# Patient Record
Sex: Male | Born: 1945 | Race: White | Hispanic: No | Marital: Married | Smoking: Former smoker
Health system: Southern US, Community
[De-identification: ages and names within clinical notes are randomized; demographics above are authoritative.]

## PROBLEM LIST (undated history)

## (undated) DIAGNOSIS — K219 Gastro-esophageal reflux disease without esophagitis: Secondary | ICD-10-CM

## (undated) DIAGNOSIS — C801 Malignant (primary) neoplasm, unspecified: Secondary | ICD-10-CM

## (undated) DIAGNOSIS — I7090 Unspecified atherosclerosis: Secondary | ICD-10-CM

## (undated) DIAGNOSIS — I341 Nonrheumatic mitral (valve) prolapse: Secondary | ICD-10-CM

## (undated) DIAGNOSIS — K5790 Diverticulosis of intestine, part unspecified, without perforation or abscess without bleeding: Secondary | ICD-10-CM

## (undated) DIAGNOSIS — N281 Cyst of kidney, acquired: Secondary | ICD-10-CM

## (undated) DIAGNOSIS — K7689 Other specified diseases of liver: Secondary | ICD-10-CM

## (undated) DIAGNOSIS — I10 Essential (primary) hypertension: Secondary | ICD-10-CM

## (undated) DIAGNOSIS — M1990 Unspecified osteoarthritis, unspecified site: Secondary | ICD-10-CM

## (undated) HISTORY — PX: COLONOSCOPY: SHX174

## (undated) HISTORY — PX: CHOLECYSTECTOMY: SHX55

## (undated) HISTORY — PX: PROSTATE SURGERY: SHX751

## (undated) HISTORY — PX: OTHER SURGICAL HISTORY: SHX169

## (undated) HISTORY — PX: UPPER GI ENDOSCOPY: SHX6162

---

## 1998-07-08 ENCOUNTER — Ambulatory Visit (HOSPITAL_COMMUNITY): Admission: RE | Admit: 1998-07-08 | Discharge: 1998-07-08 | Payer: Self-pay | Admitting: Gastroenterology

## 1999-03-31 ENCOUNTER — Ambulatory Visit (HOSPITAL_COMMUNITY): Admission: RE | Admit: 1999-03-31 | Discharge: 1999-03-31 | Payer: Self-pay | Admitting: Cardiology

## 2001-03-07 ENCOUNTER — Encounter: Admission: RE | Admit: 2001-03-07 | Discharge: 2001-03-07 | Payer: Self-pay | Admitting: Family Medicine

## 2001-03-07 ENCOUNTER — Encounter: Payer: Self-pay | Admitting: Family Medicine

## 2002-08-28 ENCOUNTER — Encounter (INDEPENDENT_AMBULATORY_CARE_PROVIDER_SITE_OTHER): Payer: Self-pay | Admitting: Specialist

## 2002-08-28 ENCOUNTER — Ambulatory Visit (HOSPITAL_COMMUNITY): Admission: RE | Admit: 2002-08-28 | Discharge: 2002-08-28 | Payer: Self-pay | Admitting: Gastroenterology

## 2005-03-18 ENCOUNTER — Encounter: Admission: RE | Admit: 2005-03-18 | Discharge: 2005-03-18 | Payer: Self-pay | Admitting: Family Medicine

## 2006-06-14 ENCOUNTER — Encounter: Admission: RE | Admit: 2006-06-14 | Discharge: 2006-06-14 | Payer: Self-pay | Admitting: Family Medicine

## 2007-06-30 ENCOUNTER — Encounter (INDEPENDENT_AMBULATORY_CARE_PROVIDER_SITE_OTHER): Payer: Self-pay | Admitting: Urology

## 2007-06-30 ENCOUNTER — Inpatient Hospital Stay (HOSPITAL_COMMUNITY): Admission: RE | Admit: 2007-06-30 | Discharge: 2007-07-01 | Payer: Self-pay | Admitting: Urology

## 2008-08-29 ENCOUNTER — Encounter: Admission: RE | Admit: 2008-08-29 | Discharge: 2008-08-29 | Payer: Self-pay | Admitting: Gastroenterology

## 2009-04-18 ENCOUNTER — Encounter: Admission: RE | Admit: 2009-04-18 | Discharge: 2009-04-18 | Payer: Self-pay | Admitting: Family Medicine

## 2010-06-01 HISTORY — PX: JOINT REPLACEMENT: SHX530

## 2010-10-14 NOTE — Op Note (Signed)
NAME:  Ian Le, MUSA NO.:  [id number]   MEDICAL RECORD NO.:  [id number]          PATIENT TYPE:  INP   LOCATION:  0002                         FACILITY:  [facility]   PHYSICIAN:  Valetta Fuller, M.D.  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   PROCEDURE PERFORMED:  Robotic-assisted laparoscopic radical retropubic  prostatectomy with bilateral pelvic lymph node dissection.   SURGEON:  Dr. Isabel Caprice.   ASSISTANT:  Dr. Laverle Patter.   ANESTHESIA:  General endotracheal   INDICATIONS:  Ian Le is a 65 year old male. He was sent to see me  because his PSA had increased from 1.8 to 3.2.  He also had a PCA 3 test  which was positive.  Ultrasound revealed a fairly normal sized prostate  at 30 grams. His biopsies were positive on the left side.  All right-  sided biopsies were negative.  The patient had a combination of Gleason  6 as well as Gleason 7 tumor.  Approximately 50% of the biopsy material  was positive at the left mid and left apex and a small less than 5% of  the biopsy material was positive at the left base.  The patient  underwent extensive consultation with regard to treatment options.  He  appeared to understand the advantages and disadvantages of a surgical  approach versus radiation for this process.  He appeared to understand  the potential complications of radical prostatectomy.  We specifically  spent significant time discussing issues with regard to incontinence and  sexual dysfunction.  The patient has done some preoperative physical  therapy.  He presents now for robotic prostatectomy.  The patient had  compression boots placed and received perioperative Unasyn.   TECHNIQUE AND  FINDINGS:  The patient was brought to the operating room  where he had successful induction of general endotracheal anesthesia. He  was placed in the mid lithotomy position and carefully secured to the  table.  All extremities carefully padded.  The patient was then placed  in steep Trendelenburg position and prepped and draped in the usual  manner.  A Foley catheter was inserted sterilely on the field.  Initial  incision site was chosen 18 cm above the pubic symphysis just to the  left of the umbilicus.  An open standard Hasson technique was utilized  to place a 12-mm cannula.  Palpation revealed no inner abdominal  adhesions. Abdominal insufflation occurred without incident.  Careful  inspection of the pelvis and lower abdomen revealed no abnormalities.  All other trocars were placed with direct visual guidance including the  12, a 5-mm assist port, three 8-mm robotic ports.  Once all ports were  positioned, the surgical cart was docked.  The bladder was filled and  the space of Retzius was developed.  The endopelvic fascia and bladder  neck regions were defatted to allow for better identification.  Endopelvic fascia was then incised from the apex to base. The levator  musculature was swept off the apex of the prostate and the puboprostatic  ligaments were taken down sharply with electrocautery. The dorsal venous  complex was isolated and then stapled with the ETS stapling device with  excellent hemostasis.  The bladder neck was then identified with the aid  of the Foley balloon and the anterior bladder neck was transected with  electrocautery scissors.  The Foley catheter was found in the midline  and brought up anteriorly for retraction. Indigo carmine was given and  we were well away from the orifices.  There did not appear to be a  middle lobe component.   The posterior bladder neck was then transected and the vas deferens and  seminal vesicles were found in the midline.  These  structures were  individually dissected free using clips for the tip of the seminal  vesicles and a minimal amount of electrocautery.  The structures were  then used to provide anterior retraction.  The plane between the  prostate and rectum was then easily established.   Attention was then turned towards nerve sparing.  On the right side more  aggressive nerve spare was performed incising the endopelvic/superficial  fascia of the prostate anterolaterally and then sweeping down all tissue  to the posterolateral quadrant preserving very nice neurovascular  bundle.  On the left side where the cancer was noted, a limited nerve  spare was done keeping more superficial layers of the fascia intact to  the prostate and sweeping just the most lateral tissue off.  Once this  was accomplished, the prostate was lifted and the pedicles were  identified.  These were taken down with surgical clips.  The anterior  urethra was then transected. The posterior urethra was then transected  after removal of the Foley catheter.  A small amount of  rectoureterolysis tissue was incised carefully and the prostate specimen  was removed from the pelvis.  Irrigation was performed. No evidence of  rectal injury and no substantial bleeding.   Attention was then turned towards pelvic lymph node dissection.  Obturator packets were taken bilaterally. The obturator nerve was  identified and preserved on both sides.  Clips were used for lymphatic  channels and small vessels.  The packet was taken up to the bifurcation  of the iliac artery.   Attention was then turned towards reconstruction.  The bladder neck was  small and did not require any closure.  Some of the Denonvilliers fascia  was incorporated along with a six o'clock of bite at the bladder neck. A  corresponding 6 o'clock suture was then placed in the urethral stump and  these structures were brought together to provide reapproximation.  Once  that was  accomplished, the rest of the anastomosis was done with a  double-armed 3-0 Monocryl suture in a 360 degree running manner.  This  provided a watertight closure.  A new Foley catheter was placed without  difficulty and irrigation revealed absolutely no leakage.  A drain was  then placed in the retropubic space and secured to the skin.  The 12 mm  trocar site was closed with a #0 Vicryl with the aid of a suture passer.  All other trocars were removed with direct visual guidance.  The  prostate specimen had been placed in an Endopouch collection bag.  It  was then removed from the camera port incision and that incision was  closed with a running #  0 Vicryl suture.  The skin was closed with clips  and all incisions were infiltrated with Marcaine.  The bladder irrigated  nicely and was clear. Blood loss was minimal.  The patient remained  completely stable through the procedure and was brought to the recovery  room in stable condition.           ______________________________  Valetta Fuller, M.D.  Electronically Signed     DSG/MEDQ  D:  06/30/2007  T:  06/30/2007  Job:  [id number]

## 2010-10-17 NOTE — Op Note (Signed)
   NAME:  Ian Le, Ian Le NO.:  [id number]   MEDICAL RECORD NO.:  [id number]                   PATIENT TYPE:  AMB   LOCATION:  ENDO                                 FACILITY:  [facility]   PHYSICIAN:  Petra Kuba, M.D.                 DATE OF BIRTH:  Dec 24, 1945   DATE OF PROCEDURE:  08/28/2002  DATE OF DISCHARGE:                                 OPERATIVE REPORT   PROCEDURE PERFORMED:  Colonoscopy.   INDICATIONS FOR PROCEDURE:  The patient has colon polyps, family history for  colon cancer.   Consent was signed after the risks, benefits, methods and options were  thoroughly discussed multiple times in the past.   MEDICINES USED:  Demerol 50, Versed 5.   DESCRIPTION OF PROCEDURE:  The rectal inspection was pertinent for external  hemorrhoids.  A small digital exam was negative.  The video colonoscope was  inserted, easily advanced around the colon to the cecum.  No obvious  abnormality was seen on insertion.  The cecum was identified by the  appendiceal orifice and the ileocecal valve.  The prep was fairly adequate.  With washing and suctioning, adequate visualization was obtained.  The scope  was slowly withdrawn.  Other than a tiny mid transverse possible polyp which  was hot biopsied x1, and some early occasional left-sided diverticula, no  abnormalities were seen as we slowly withdrew back in the rectum.  Once back  in the rectum, the scope was retroflexed, and pertinent for some internal  hemorrhoids.  The scope was straightened, readvanced and withdrawn up the  left side of the colon.  The air was suctioned.  The scope was withdrawn.  The patient tolerated the procedure well.  There were no obvious immediate  complications.   ENDOSCOPIC DIAGNOSES:  1. Internal and external hemorrhoids.  2. Left occasional diverticula.  3. Tiny mid transverse polyp, hot biopsied.  4. Otherwise within normal limits to the cecum.    PLAN:  Await pathology, would  probably recheck colon screening in five  years.  Happy to see back p.r.n., otherwise return to the care of Dr.  Manus Gunning.  Further indications for health care maintenance includes yearly  rectals and guaiacs.                                                Petra Kuba, M.D.    MEM/MEDQ  D:  08/28/2002  T:  08/28/2002  Job:  [id number]   cc:   Bryan Lemma. Manus Gunning, M.D.  [address] [place]  [place] [address]  Fax: [phone]

## 2010-12-26 ENCOUNTER — Other Ambulatory Visit: Payer: Self-pay | Admitting: Family Medicine

## 2010-12-26 DIAGNOSIS — R51 Headache: Secondary | ICD-10-CM

## 2010-12-31 ENCOUNTER — Ambulatory Visit
Admission: RE | Admit: 2010-12-31 | Discharge: 2010-12-31 | Disposition: A | Discharge status: Home / Self Care | Payer: Medicare Other | Source: Ambulatory Visit | Attending: Family Medicine | Admitting: Family Medicine

## 2010-12-31 DIAGNOSIS — R51 Headache: Secondary | ICD-10-CM

## 2011-02-19 ENCOUNTER — Encounter (HOSPITAL_COMMUNITY): Payer: Medicare Other

## 2011-02-19 ENCOUNTER — Other Ambulatory Visit: Payer: Self-pay | Admitting: Orthopedic Surgery

## 2011-02-19 ENCOUNTER — Ambulatory Visit (HOSPITAL_COMMUNITY)
Admission: RE | Admit: 2011-02-19 | Discharge: 2011-02-19 | Disposition: A | Discharge status: Home / Self Care | Payer: Medicare Other | Source: Ambulatory Visit | Attending: Orthopedic Surgery | Admitting: Orthopedic Surgery

## 2011-02-19 ENCOUNTER — Other Ambulatory Visit (HOSPITAL_COMMUNITY): Payer: Self-pay | Admitting: Orthopedic Surgery

## 2011-02-19 DIAGNOSIS — Z01818 Encounter for other preprocedural examination: Secondary | ICD-10-CM | POA: Insufficient documentation

## 2011-02-19 DIAGNOSIS — I10 Essential (primary) hypertension: Secondary | ICD-10-CM | POA: Insufficient documentation

## 2011-02-19 DIAGNOSIS — M1710 Unilateral primary osteoarthritis, unspecified knee: Secondary | ICD-10-CM | POA: Insufficient documentation

## 2011-02-19 DIAGNOSIS — Z01811 Encounter for preprocedural respiratory examination: Secondary | ICD-10-CM

## 2011-02-19 DIAGNOSIS — Z01812 Encounter for preprocedural laboratory examination: Secondary | ICD-10-CM | POA: Insufficient documentation

## 2011-02-19 DIAGNOSIS — I771 Stricture of artery: Secondary | ICD-10-CM | POA: Insufficient documentation

## 2011-02-19 LAB — URINALYSIS, ROUTINE W REFLEX MICROSCOPIC
Bilirubin Urine: NEGATIVE
Glucose, UA: NEGATIVE mg/dL
Hgb urine dipstick: NEGATIVE
Ketones, ur: NEGATIVE mg/dL
Leukocytes, UA: NEGATIVE
pH: 6.5 (ref 5.0–8.0)

## 2011-02-19 LAB — CBC
HCT: 39.7 % (ref 39.0–52.0)
Hemoglobin: 13.5 g/dL (ref 13.0–17.0)
MCH: 30.8 pg (ref 26.0–34.0)
MCHC: 34 g/dL (ref 30.0–36.0)
MCV: 90.4 fL (ref 78.0–100.0)
RBC: 4.39 MIL/uL (ref 4.22–5.81)
RDW: 13.1 % (ref 11.5–15.5)

## 2011-02-19 LAB — COMPREHENSIVE METABOLIC PANEL
ALT: 15 U/L (ref 0–53)
AST: 16 U/L (ref 0–37)
Albumin: 3.7 g/dL (ref 3.5–5.2)
Alkaline Phosphatase: 94 U/L (ref 39–117)
Chloride: 106 mEq/L (ref 96–112)
Potassium: 4.3 mEq/L (ref 3.5–5.1)
Sodium: 139 mEq/L (ref 135–145)
Total Bilirubin: 0.8 mg/dL (ref 0.3–1.2)
Total Protein: 6.6 g/dL (ref 6.0–8.3)

## 2011-02-19 LAB — SURGICAL PCR SCREEN
MRSA, PCR: NEGATIVE
Staphylococcus aureus: NEGATIVE

## 2011-02-19 LAB — APTT: aPTT: 33 s (ref 24–37)

## 2011-02-20 LAB — BASIC METABOLIC PANEL
BUN: 19
CO2: 27
Chloride: 108
Creatinine, Ser: 1.27
Creatinine, Ser: 1.34
GFR calc Af Amer: >60
GFR calc non Af Amer: 54 — ABNORMAL LOW
Glucose, Bld: 106 — ABNORMAL HIGH
Potassium: 3.9
Sodium: 139

## 2011-02-20 LAB — TYPE AND SCREEN
ABO/RH(D): O POS
Antibody Screen: NEGATIVE

## 2011-02-20 LAB — CBC
Hemoglobin: 12 — ABNORMAL LOW
MCHC: 34.7
MCV: 91.5
RBC: 3.79 — ABNORMAL LOW

## 2011-02-20 LAB — DIFFERENTIAL
Basophils Relative: 0
Eosinophils Absolute: 0.1
Monocytes Absolute: 0.7
Monocytes Relative: 10

## 2011-02-25 ENCOUNTER — Inpatient Hospital Stay (HOSPITAL_COMMUNITY)
Admission: RE | Admit: 2011-02-25 | Discharge: 2011-02-28 | DRG: 470 | Disposition: A | Discharge status: Home Health | Payer: Medicare Other | Source: Ambulatory Visit | Attending: Orthopedic Surgery | Admitting: Orthopedic Surgery

## 2011-02-25 DIAGNOSIS — E876 Hypokalemia: Secondary | ICD-10-CM | POA: Diagnosis not present

## 2011-02-25 DIAGNOSIS — R51 Headache: Secondary | ICD-10-CM | POA: Diagnosis present

## 2011-02-25 DIAGNOSIS — Z8781 Personal history of (healed) traumatic fracture: Secondary | ICD-10-CM

## 2011-02-25 DIAGNOSIS — Z79899 Other long term (current) drug therapy: Secondary | ICD-10-CM

## 2011-02-25 DIAGNOSIS — H9319 Tinnitus, unspecified ear: Secondary | ICD-10-CM | POA: Diagnosis present

## 2011-02-25 DIAGNOSIS — H547 Unspecified visual loss: Secondary | ICD-10-CM | POA: Diagnosis present

## 2011-02-25 DIAGNOSIS — B029 Zoster without complications: Secondary | ICD-10-CM | POA: Diagnosis present

## 2011-02-25 DIAGNOSIS — E7800 Pure hypercholesterolemia, unspecified: Secondary | ICD-10-CM | POA: Diagnosis present

## 2011-02-25 DIAGNOSIS — Z7382 Dual sensory impairment: Secondary | ICD-10-CM

## 2011-02-25 DIAGNOSIS — I359 Nonrheumatic aortic valve disorder, unspecified: Secondary | ICD-10-CM | POA: Diagnosis present

## 2011-02-25 DIAGNOSIS — M1710 Unilateral primary osteoarthritis, unspecified knee: Principal | ICD-10-CM | POA: Diagnosis present

## 2011-02-25 DIAGNOSIS — I10 Essential (primary) hypertension: Secondary | ICD-10-CM | POA: Diagnosis present

## 2011-02-25 DIAGNOSIS — Z7982 Long term (current) use of aspirin: Secondary | ICD-10-CM

## 2011-02-25 DIAGNOSIS — Z01812 Encounter for preprocedural laboratory examination: Secondary | ICD-10-CM

## 2011-02-25 DIAGNOSIS — K219 Gastro-esophageal reflux disease without esophagitis: Secondary | ICD-10-CM | POA: Diagnosis present

## 2011-02-25 DIAGNOSIS — H9190 Unspecified hearing loss, unspecified ear: Secondary | ICD-10-CM | POA: Diagnosis present

## 2011-02-26 LAB — CBC
HCT: 30.4 % — ABNORMAL LOW (ref 39.0–52.0)
MCV: 91.6 fL (ref 78.0–100.0)
RBC: 3.32 MIL/uL — ABNORMAL LOW (ref 4.22–5.81)
WBC: 7.7 10*3/uL (ref 4.0–10.5)

## 2011-02-26 LAB — BASIC METABOLIC PANEL
BUN: 14 mg/dL (ref 6–23)
CO2: 27 mEq/L (ref 19–32)
Chloride: 104 mEq/L (ref 96–112)
Creatinine, Ser: 1.17 mg/dL (ref 0.50–1.35)

## 2011-02-27 LAB — CBC
HCT: 26.5 % — ABNORMAL LOW (ref 39.0–52.0)
MCHC: 34 g/dL (ref 30.0–36.0)
MCV: 91.7 fL (ref 78.0–100.0)
RDW: 13.2 % (ref 11.5–15.5)
WBC: 6.9 10*3/uL (ref 4.0–10.5)

## 2011-02-27 LAB — BASIC METABOLIC PANEL
BUN: 11 mg/dL (ref 6–23)
Chloride: 102 mEq/L (ref 96–112)
Creatinine, Ser: 1.19 mg/dL (ref 0.50–1.35)
GFR calc Af Amer: >60 mL/min (ref >60)
Glucose, Bld: 139 mg/dL — ABNORMAL HIGH (ref 70–99)

## 2011-02-28 LAB — CBC
HCT: 24.8 % — ABNORMAL LOW (ref 39.0–52.0)
MCH: 30.9 pg (ref 26.0–34.0)
MCHC: 33.5 g/dL (ref 30.0–36.0)
RDW: 13.1 % (ref 11.5–15.5)

## 2011-02-28 LAB — BASIC METABOLIC PANEL
BUN: 11 mg/dL (ref 6–23)
Calcium: 8.2 mg/dL — ABNORMAL LOW (ref 8.4–10.5)
Creatinine, Ser: 1.18 mg/dL (ref 0.50–1.35)
GFR calc Af Amer: >60 mL/min (ref >60)
GFR calc non Af Amer: >60 mL/min (ref >60)
Potassium: 3.3 mEq/L — ABNORMAL LOW (ref 3.5–5.1)
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
## 2011-03-11 NOTE — Op Note (Signed)
NAMEMABLE, LASHLEY NO.:  [id number]  MEDICAL RECORD NO.:  [id number]  LOCATION:  1615                         FACILITY:  [facility]  PHYSICIAN:  Ollen Gross, M.D.    DATE OF BIRTH:  01-13-1946  DATE OF PROCEDURE:  02/25/2011 DATE OF DISCHARGE:                              OPERATIVE REPORT   PREOPERATIVE DIAGNOSIS:  Osteoarthritis, right knee.  POSTOPERATIVE DIAGNOSIS:  Osteoarthritis, right knee.  PROCEDURE:  Right total knee arthroplasty.  SURGEON:  Ollen Gross, M.D.  ASSISTANT:  Alexzandrew L. Perkins, P.A.C.  ANESTHESIA:  Attempted spinal, then conversion to general.  ESTIMATED BLOOD LOSS:  Minimal.  DRAINS:  Hemovac times one.  TOURNIQUET TIME:  39 minutes at 300 mmHg.  COMPLICATIONS:  None.  CONDITION:  Stable to recovery.  CLINICAL NOTE:  Mr. Ian Le is a 65 year old male with advanced end- stage arthritis of the right knee with progressively worsening pain and dysfunction.  He has bone-on-bone arthritis, medial, lateral and patellofemoral compartments.  He has failed nonoperative management including injection.  He presents now for right total knee arthroplasty.  PROCEDURE IN DETAIL:  After tenth administration of spinal anesthetic, a tourniquet was placed high on his right thigh and right lower extremity prepped and draped in usual sterile fashion.  Extremity was wrapped in Esmarch, knee flexed and tourniquet inflated to 300 mmHg.  We tested the spinal and he had sensation in the knee.  He was subsequently converted to general anesthetic.  Incision was then made with a 10 blade through subcutaneous tissue to the level of the extensor mechanism.  A fresh blade was used to make a medial parapatellar arthrotomy.  Soft tissue on the proximal medial tibia subperiosteally elevated to the joint line with the knife into the semimembranosus bursa with a Cobb elevator. Soft tissue laterally is elevated with attention being paid to  avoid patellar tendon on tibial tubercle.  The patella was everted, knee flexed 90 degrees, and ACL and PCL removed.  Drill was used to create a starting hole in the distal femur and the canal was thoroughly irrigated.  A 5-degree right valgus alignment guide was placed.  The distal femoral cutting block was placed and was pinned to remove 10-mm off the distal femur.  Distal femoral resection was made with an oscillating saw.  The tibia subluxed forward and the menisci are removed.  Extramedullary tibial alignment guide was placed referencing proximally at the medial aspect of the tibial tubercle and distally along the second metatarsal axis and tibial crest.  Block is pinned to remove 2-mm off the more deficient medial thigh.  Tibial resection was made with an oscillating saw.  There was exposed bone in both medial and lateral compartments. Resection was made with an oscillating saw.  Size 5 was the most appropriate tibial component.  Proximal tibia was prepared to modular drill and keel punch for the size 5.  Femoral sizing guide was placed, size 5 was the most appropriate on the femur.  Rotations marked at the epicondylar axis and confirmed by creating rectangular flexion gap at 90 degrees.  The block is pinned in this rotation and the anterior, posterior and chamfer cuts are made. The intercondylar  block is placed and that cut was made.  Trial size 5 posterior stabilized femur was placed.  A 10-mm posterior stabilized rotating platform insert trial was placed with 10 full extensions achieved with excellent varus-valgus and anterior-posterior balance throughout full range of motion.  Patella was everted and thickness measured to be 25-mm.  Freehand resection taken to 14 mm, 41 template was placed, lug holes were drilled, trial patella was placed and it tracks normally.  Osteophytes removed off the posterior femur with the trial in place.  All trials are removed and a cut bone  surface was prepared with pulsatile lavage.  Cements mixed and once ready for implantation, the size 5 mobile-bearing tibial tray size 5, posterior stabilized femur, and 41 patella are cemented into place.  Patella was held with the clamp.  Trial of 10-mm inserts placed, knee held in full extension, all extruded cement removed.  Once cement fully hardened and the knee permanent, 10-mm posterior stabilized rotating platform insert was placed to the tibial tray.  The knee was copiously irrigated with saline solution and the arthrotomy closed over Hemovac drain with interrupted #1 PDS.  Flexion against gravity was 140 degrees and the patella tracks normally.  Tourniquet released total time of 39 minutes. Subcutaneous was closed with interrupted 2-0 Vicryl and subcuticular running 4-0 Monocryl.  Catheter for Marcaine pain pump was placed and pumps initiated.  Incisions cleaned and dried and Steri-Strips and bulky sterile dressing were applied.  He was then placed into a knee immobilizer, awakened and transported to recovery in stable condition.  Please note that it was a medical necessity for a surgical assistant to help perform this procedure in a safe and expeditious manner.  Surgical assistant was needed for retraction of ligaments and vital neurovascular structures to protect them during the procedure.  In addition, surgical assistant was necessary for proper placement of the limb to allow for anatomic placement of the prosthetic components.     Ollen Gross, M.D.     FA/MEDQ  D:  02/25/2011  T:  02/26/2011  Job:  [id number]  Electronically Signed by Ollen Gross M.D. on 03/11/2011 11:18:53 AM

## 2011-03-25 NOTE — Discharge Summary (Signed)
  Ian Le, [name]:  [id number]  MEDICAL RECORD NO.:  [id number]  LOCATION:  1615                         FACILITY:  [facility]  PHYSICIAN:  Ollen Gross, M.D.    DATE OF BIRTH:  March 09, 1946  DATE OF ADMISSION:  02/25/2011 DATE OF DISCHARGE:  02/28/2011                              DISCHARGE SUMMARY   ADMITTING DIAGNOSES: 1. Osteoarthritis, right knee. 2. Impaired hearing. 3. Impaired vision. 4. Tinnitus. 5. History of shingles. 6. Hypertension. 7. Hypercholesterolemia. 8. Mitral valve prolapse. 9. Reflux disease.  DISCHARGE DIAGNOSES: 1. Osteoarthritis of right knee, status post right total knee     replacement arthroplasty. 2. Postoperative blood loss anemia, did not require transfusion. 3. Mild postoperative hypokalemia.  SURGEON:  Ollen Gross, M.D.  ASSISTANT:  Alexzandrew L. Perkins, P.A.C.  ANESTHESIA:  Attempted spinal, conversion to general.  TOURNIQUET TIME:  39 minutes.  CONSULTATION:  None.  BRIEF HISTORY:  The patient is a 65 year old male with advanced arthritis of the right knee, progressive worsening pain and dysfunction, bone-on-bone arthritis of medial lateral patellofemoral compartments, now presents for a total knee arthroplasty.  LABORATORY DATA:  CBC on admission not scanned in the chart, but the postop hemoglobin was 10.4, it drifted down to 9.  Last noted hemoglobin and hematocrit 8.3.  Chem panel on admission not scanned in the chart. The electrolytes followed.  Serial BMETs; potassium did drop from 4.1 to 3.9.  Blood group type O positive.  HOSPITAL COURSE:  The patient was admitted to [facility], taken to OR, underwent above-stated procedure without complication.  The patient tolerated the procedure well, later transferred from recovery room to orthopedic floor, started on p.o. and IV analgesics, doing pretty well on the morning of day #1, had good urine output, started back on his home meds.   Hemoglobin 10.4.  Started getting up with physical therapy.  By day #2, he was up, walking very well.  He was actually hurt a little bit more on day #2 because of having well idea with therapy on day #1.  We switched him over to p.o. meds.  Dressing changed, looked good.  We removed the pain pump.  He continued to ambulate with therapy and the following day on postop day #3, he was doing well.  He was tolerating his meds.  He had a little bit of low of potassium, was placed on some p.o. K-Dur prior to discharge.  Once he met his goals, he was discharged home.  DISCHARGE PLAN: 1. The patient discharged home on February 28, 2011. 2. Discharge diagnoses, please see above. 3. Discharge meds; Dilaudid, Robaxin, Xarelto. 4. Diet, heart healthy diet. 5. Activity, weightbearing as tolerated, total knee protocol. 6. Followup in 2 weeks.  DISPOSITION:  Home.  CONDITION ON DISCHARGE:  Improving.     [name], P.A.C.   ______________________________ Ollen Gross, M.D.    ALP/MEDQ  D:  03/13/2011  T:  03/13/2011  Job:  [id number]  cc:   Bryan Lemma. Manus Gunning, M.D. Fax: [phone]  Electronically Signed by Patrica Duel P.A.C. on 03/21/2011 09:14:45 AM Electronically Signed by Ollen Gross M.D. on 03/25/2011 11:23:51 AM

## 2012-05-12 ENCOUNTER — Other Ambulatory Visit (HOSPITAL_COMMUNITY): Payer: Self-pay | Admitting: Orthopedic Surgery

## 2012-05-12 DIAGNOSIS — T84039A Mechanical loosening of unspecified internal prosthetic joint, initial encounter: Secondary | ICD-10-CM

## 2012-05-12 DIAGNOSIS — M25561 Pain in right knee: Secondary | ICD-10-CM

## 2012-05-26 ENCOUNTER — Encounter (HOSPITAL_COMMUNITY)
Admission: RE | Admit: 2012-05-26 | Discharge: 2012-05-26 | Disposition: A | Discharge status: Home / Self Care | Payer: Self-pay | Source: Ambulatory Visit | Attending: Orthopedic Surgery | Admitting: Orthopedic Surgery

## 2012-05-26 DIAGNOSIS — M25561 Pain in right knee: Secondary | ICD-10-CM

## 2012-05-26 DIAGNOSIS — T84039A Mechanical loosening of unspecified internal prosthetic joint, initial encounter: Secondary | ICD-10-CM | POA: Insufficient documentation

## 2012-05-26 DIAGNOSIS — Z96659 Presence of unspecified artificial knee joint: Secondary | ICD-10-CM | POA: Insufficient documentation

## 2012-05-26 MED ORDER — TECHNETIUM TC 99M MEDRONATE IV KIT
25.0000 | PACK | Freq: Once | INTRAVENOUS | Status: AC | PRN
  Administered 2012-05-26: 25 via INTRAVENOUS

## 2012-06-23 ENCOUNTER — Other Ambulatory Visit: Payer: Self-pay | Admitting: Orthopedic Surgery

## 2012-06-23 MED ORDER — DEXAMETHASONE SODIUM PHOSPHATE 10 MG/ML IJ SOLN: 10.0000 mg | Freq: Once | INTRAMUSCULAR | Status: DC

## 2012-06-23 NOTE — Progress Notes (Signed)
Need orders in EPIC.  Thanks.  Surgery scheduled for 07/06/12.

## 2012-06-24 ENCOUNTER — Encounter (HOSPITAL_COMMUNITY): Payer: Self-pay | Admitting: Pharmacy Technician

## 2012-06-27 NOTE — Patient Instructions (Signed)
Ian Le  06/27/2012   Your procedure is scheduled on:  07/06/12   Report to [name] at 0730 AM.  Call this number if you have problems the morning of surgery: [phone]   Remember:   Do not eat food or drink liquids after midnight.   Take these medicines the morning of surgery with A SIP OF WATER:    Do not wear jewelry,   Do not wear lotions, powders, or perfumes. .  . Men may shave face and neck.  Do not bring valuables to the hospital.  Contacts, dentures or bridgework may not be worn into surgery.       Patients discharged the day of surgery will not be allowed to drive  home.  Name and phone number of your driver:               SEE CHG INSTRUCTION SHEET    Please read over the following fact sheets that you were given: MRSA Information, coughing and deep breathing exercises, leg exercises, Incentive Spirometry Fact Sheet.               Failure to comply with these instructions may result in cancellation of your surgery.                Patient Signature ____________________________              Nurse Signature _____________________________

## 2012-06-28 ENCOUNTER — Ambulatory Visit (HOSPITAL_COMMUNITY)
Admission: RE | Admit: 2012-06-28 | Discharge: 2012-06-28 | Disposition: A | Discharge status: Home / Self Care | Payer: Medicare Other | Source: Ambulatory Visit | Attending: Orthopedic Surgery | Admitting: Orthopedic Surgery

## 2012-06-28 ENCOUNTER — Encounter (HOSPITAL_COMMUNITY)
Admission: RE | Admit: 2012-06-28 | Discharge: 2012-06-28 | Disposition: A | Discharge status: Home / Self Care | Payer: Medicare Other | Source: Ambulatory Visit | Attending: Orthopedic Surgery | Admitting: Orthopedic Surgery

## 2012-06-28 ENCOUNTER — Encounter (HOSPITAL_COMMUNITY): Payer: Self-pay

## 2012-06-28 DIAGNOSIS — Z01810 Encounter for preprocedural cardiovascular examination: Secondary | ICD-10-CM | POA: Insufficient documentation

## 2012-06-28 DIAGNOSIS — I10 Essential (primary) hypertension: Secondary | ICD-10-CM | POA: Insufficient documentation

## 2012-06-28 DIAGNOSIS — Z01812 Encounter for preprocedural laboratory examination: Secondary | ICD-10-CM | POA: Insufficient documentation

## 2012-06-28 DIAGNOSIS — Z01818 Encounter for other preprocedural examination: Secondary | ICD-10-CM | POA: Insufficient documentation

## 2012-06-28 DIAGNOSIS — I4510 Unspecified right bundle-branch block: Secondary | ICD-10-CM | POA: Insufficient documentation

## 2012-06-28 DIAGNOSIS — R9431 Abnormal electrocardiogram [ECG] [EKG]: Secondary | ICD-10-CM | POA: Insufficient documentation

## 2012-06-28 HISTORY — DX: Essential (primary) hypertension: I10

## 2012-06-28 HISTORY — DX: Unspecified osteoarthritis, unspecified site: M19.90

## 2012-06-28 HISTORY — DX: Malignant (primary) neoplasm, unspecified: C80.1

## 2012-06-28 HISTORY — DX: Nonrheumatic mitral (valve) prolapse: I34.1

## 2012-06-28 LAB — BASIC METABOLIC PANEL
BUN: 18 mg/dL (ref 6–23)
CO2: 26 mEq/L (ref 19–32)
Calcium: 8.8 mg/dL (ref 8.4–10.5)
Creatinine, Ser: 1.27 mg/dL (ref 0.50–1.35)
GFR calc non Af Amer: 56 mL/min — ABNORMAL LOW (ref >90)
Glucose, Bld: 92 mg/dL (ref 70–99)
Sodium: 139 mEq/L (ref 135–145)

## 2012-06-28 LAB — SURGICAL PCR SCREEN: MRSA, PCR: NEGATIVE

## 2012-06-28 LAB — CBC
Hemoglobin: 13.4 g/dL (ref 13.0–17.0)
MCH: 30 pg (ref 26.0–34.0)
MCHC: 33 g/dL (ref 30.0–36.0)
MCV: 90.8 fL (ref 78.0–100.0)
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
## 2012-07-06 ENCOUNTER — Encounter (HOSPITAL_COMMUNITY): Payer: Self-pay | Admitting: Anesthesiology

## 2012-07-06 ENCOUNTER — Ambulatory Visit (HOSPITAL_COMMUNITY): Payer: Medicare Other | Admitting: Anesthesiology

## 2012-07-06 ENCOUNTER — Encounter (HOSPITAL_COMMUNITY): Payer: Self-pay

## 2012-07-06 ENCOUNTER — Encounter (HOSPITAL_COMMUNITY): Payer: Self-pay | Admitting: *Deleted

## 2012-07-06 ENCOUNTER — Encounter (HOSPITAL_COMMUNITY)
Admission: RE | Disposition: A | Discharge status: Home / Self Care | Payer: Self-pay | Source: Ambulatory Visit | Attending: Orthopedic Surgery

## 2012-07-06 ENCOUNTER — Ambulatory Visit (HOSPITAL_COMMUNITY)
Admission: RE | Admit: 2012-07-06 | Discharge: 2012-07-06 | Disposition: A | Discharge status: Home / Self Care | Payer: Medicare Other | Source: Ambulatory Visit | Attending: Orthopedic Surgery | Admitting: Orthopedic Surgery

## 2012-07-06 DIAGNOSIS — M6580 Other synovitis and tenosynovitis, unspecified site: Secondary | ICD-10-CM | POA: Insufficient documentation

## 2012-07-06 DIAGNOSIS — Z8546 Personal history of malignant neoplasm of prostate: Secondary | ICD-10-CM | POA: Insufficient documentation

## 2012-07-06 DIAGNOSIS — I10 Essential (primary) hypertension: Secondary | ICD-10-CM | POA: Insufficient documentation

## 2012-07-06 DIAGNOSIS — M65969 Unspecified synovitis and tenosynovitis, unspecified lower leg: Secondary | ICD-10-CM | POA: Diagnosis present

## 2012-07-06 DIAGNOSIS — Z7982 Long term (current) use of aspirin: Secondary | ICD-10-CM | POA: Insufficient documentation

## 2012-07-06 DIAGNOSIS — M659 Synovitis and tenosynovitis, unspecified: Secondary | ICD-10-CM | POA: Diagnosis present

## 2012-07-06 DIAGNOSIS — M25569 Pain in unspecified knee: Secondary | ICD-10-CM | POA: Insufficient documentation

## 2012-07-06 DIAGNOSIS — Z96659 Presence of unspecified artificial knee joint: Secondary | ICD-10-CM | POA: Insufficient documentation

## 2012-07-06 DIAGNOSIS — I059 Rheumatic mitral valve disease, unspecified: Secondary | ICD-10-CM | POA: Insufficient documentation

## 2012-07-06 DIAGNOSIS — Z79899 Other long term (current) drug therapy: Secondary | ICD-10-CM | POA: Insufficient documentation

## 2012-07-06 HISTORY — PX: KNEE ARTHROSCOPY: SHX127

## 2012-07-06 SURGERY — ARTHROSCOPY, KNEE
Anesthesia: General | Site: Knee | Laterality: Right | Wound class: Clean

## 2012-07-06 MED ORDER — ACETAMINOPHEN 10 MG/ML IV SOLN
1000.0000 mg | Freq: Once | INTRAVENOUS | Status: AC
  Administered 2012-07-06: 1000 mg via INTRAVENOUS

## 2012-07-06 MED ORDER — CEFAZOLIN SODIUM-DEXTROSE 2-3 GM-% IV SOLR
INTRAVENOUS | Status: AC
  Filled 2012-07-06: qty 50

## 2012-07-06 MED ORDER — OXYCODONE HCL 5 MG PO TABS: 5.0000 mg | ORAL_TABLET | Freq: Once | ORAL | Status: DC | PRN

## 2012-07-06 MED ORDER — BUPIVACAINE-EPINEPHRINE 0.25% -1:200000 IJ SOLN
INTRAMUSCULAR | Status: DC | PRN
  Administered 2012-07-06: 20 mL

## 2012-07-06 MED ORDER — MIDAZOLAM HCL 5 MG/5ML IJ SOLN
INTRAMUSCULAR | Status: DC | PRN
  Administered 2012-07-06: 2 mg via INTRAVENOUS

## 2012-07-06 MED ORDER — ONDANSETRON HCL 4 MG/2ML IJ SOLN
INTRAMUSCULAR | Status: DC | PRN
  Administered 2012-07-06: 4 mg via INTRAVENOUS

## 2012-07-06 MED ORDER — LACTATED RINGERS IV SOLN
INTRAVENOUS | Status: DC
  Administered 2012-07-06: 1000 mL via INTRAVENOUS

## 2012-07-06 MED ORDER — HYDROMORPHONE HCL PF 1 MG/ML IJ SOLN
0.2500 mg | INTRAMUSCULAR | Status: DC | PRN
  Administered 2012-07-06 (×2): 0.5 mg via INTRAVENOUS

## 2012-07-06 MED ORDER — ACETAMINOPHEN 10 MG/ML IV SOLN
INTRAVENOUS | Status: AC
  Filled 2012-07-06: qty 100

## 2012-07-06 MED ORDER — CHLORHEXIDINE GLUCONATE 4 % EX LIQD
60.0000 mL | Freq: Once | CUTANEOUS | Status: DC
  Filled 2012-07-06: qty 60

## 2012-07-06 MED ORDER — HYDROMORPHONE HCL PF 1 MG/ML IJ SOLN
INTRAMUSCULAR | Status: AC
  Filled 2012-07-06: qty 1

## 2012-07-06 MED ORDER — OXYCODONE HCL 5 MG/5ML PO SOLN
5.0000 mg | Freq: Once | ORAL | Status: DC | PRN
  Filled 2012-07-06: qty 5

## 2012-07-06 MED ORDER — LIDOCAINE HCL (CARDIAC) 20 MG/ML IV SOLN
INTRAVENOUS | Status: DC | PRN
  Administered 2012-07-06: 80 mg via INTRAVENOUS

## 2012-07-06 MED ORDER — SODIUM CHLORIDE 0.9 % IV SOLN: INTRAVENOUS | Status: DC

## 2012-07-06 MED ORDER — ACETAMINOPHEN 10 MG/ML IV SOLN: 1000.0000 mg | Freq: Once | INTRAVENOUS | Status: DC | PRN

## 2012-07-06 MED ORDER — OXYCODONE HCL 5 MG PO TABS: 5.0000 mg | ORAL_TABLET | ORAL | Status: DC | PRN

## 2012-07-06 MED ORDER — PROPOFOL 10 MG/ML IV BOLUS
INTRAVENOUS | Status: DC | PRN
  Administered 2012-07-06: 180 mg via INTRAVENOUS

## 2012-07-06 MED ORDER — DEXAMETHASONE SODIUM PHOSPHATE 4 MG/ML IJ SOLN
INTRAMUSCULAR | Status: DC | PRN
  Administered 2012-07-06: 10 mg via INTRAVENOUS

## 2012-07-06 MED ORDER — EPHEDRINE SULFATE 50 MG/ML IJ SOLN
INTRAMUSCULAR | Status: DC | PRN
  Administered 2012-07-06: 5 mg via INTRAVENOUS
  Administered 2012-07-06: 10 mg via INTRAVENOUS
  Administered 2012-07-06: 5 mg via INTRAVENOUS

## 2012-07-06 MED ORDER — MEPERIDINE HCL 50 MG/ML IJ SOLN: 6.2500 mg | INTRAMUSCULAR | Status: DC | PRN

## 2012-07-06 MED ORDER — CEFAZOLIN SODIUM-DEXTROSE 2-3 GM-% IV SOLR
2.0000 g | INTRAVENOUS | Status: AC
  Administered 2012-07-06: 2 g via INTRAVENOUS

## 2012-07-06 MED ORDER — BUPIVACAINE-EPINEPHRINE PF 0.25-1:200000 % IJ SOLN
INTRAMUSCULAR | Status: AC
  Filled 2012-07-06: qty 30

## 2012-07-06 MED ORDER — METHOCARBAMOL 500 MG PO TABS: 500.0000 mg | ORAL_TABLET | Freq: Four times a day (QID) | ORAL | Status: DC | PRN

## 2012-07-06 MED ORDER — FENTANYL CITRATE 0.05 MG/ML IJ SOLN
INTRAMUSCULAR | Status: DC | PRN
  Administered 2012-07-06 (×2): 50 ug via INTRAVENOUS

## 2012-07-06 MED ORDER — KETOROLAC TROMETHAMINE 30 MG/ML IJ SOLN
INTRAMUSCULAR | Status: DC | PRN
  Administered 2012-07-06: 15 mg via INTRAVENOUS

## 2012-07-06 MED ORDER — LACTATED RINGERS IR SOLN
Status: DC | PRN
  Administered 2012-07-06: 9000 mL

## 2012-07-06 MED ORDER — PROMETHAZINE HCL 25 MG/ML IJ SOLN
6.2500 mg | INTRAMUSCULAR | Status: AC | PRN
  Administered 2012-07-06 (×2): 6.25 mg via INTRAVENOUS
  Filled 2012-07-06: qty 1

## 2012-07-06 SURGICAL SUPPLY — 25 items
BANDAGE ELASTIC 6 VELCRO ST LF (GAUZE/BANDAGES/DRESSINGS) ×2 IMPLANT
BLADE 4.2CUDA (BLADE) ×2 IMPLANT
CLOTH BEACON ORANGE TIMEOUT ST (SAFETY) ×2 IMPLANT
CUFF TOURN SGL QUICK 34 (TOURNIQUET CUFF) ×1
CUFF TRNQT CYL 34X4X40X1 (TOURNIQUET CUFF) ×1 IMPLANT
DRAPE U-SHAPE 47X51 STRL (DRAPES) ×2 IMPLANT
DRSG EMULSION OIL 3X3 NADH (GAUZE/BANDAGES/DRESSINGS) ×2 IMPLANT
DURAPREP 26ML APPLICATOR (WOUND CARE) ×2 IMPLANT
GLOVE BIO SURGEON STRL SZ8 (GLOVE) ×2 IMPLANT
GLOVE BIOGEL PI IND STRL 8 (GLOVE) ×1 IMPLANT
GLOVE BIOGEL PI INDICATOR 8 (GLOVE) ×1
GOWN STRL NON-REIN LRG LVL3 (GOWN DISPOSABLE) ×2 IMPLANT
MANIFOLD NEPTUNE II (INSTRUMENTS) ×4 IMPLANT
PACK ARTHROSCOPY WL (CUSTOM PROCEDURE TRAY) ×2 IMPLANT
PACK ICE MAXI GEL EZY WRAP (MISCELLANEOUS) ×6 IMPLANT
PADDING CAST COTTON 6X4 STRL (CAST SUPPLIES) ×2 IMPLANT
POSITIONER SURGICAL ARM (MISCELLANEOUS) ×2 IMPLANT
SET ARTHROSCOPY TUBING (MISCELLANEOUS) ×1
SET ARTHROSCOPY TUBING LN (MISCELLANEOUS) ×1 IMPLANT
SPONGE GAUZE 4X4 12PLY (GAUZE/BANDAGES/DRESSINGS) ×2 IMPLANT
SUT ETHILON 4 0 PS 2 18 (SUTURE) ×2 IMPLANT
SYR 20CC LL (SYRINGE) ×2 IMPLANT
TOWEL OR 17X26 10 PK STRL BLUE (TOWEL DISPOSABLE) ×2 IMPLANT
WAND 90 DEG TURBOVAC W/CORD (SURGICAL WAND) ×2 IMPLANT
WRAP KNEE MAXI GEL POST OP (GAUZE/BANDAGES/DRESSINGS) ×4 IMPLANT

## 2012-07-06 NOTE — Op Note (Signed)
NAME:  [name], [name] NO.:  [id number]  MEDICAL RECORD NO.:  [id number]  LOCATION:  [facility]                         FACILITY:  [facility]  PHYSICIAN:  Ollen Gross, M.D.    DATE OF BIRTH:  Nov 11, 1945  DATE OF PROCEDURE: DATE OF DISCHARGE:                              OPERATIVE REPORT   PREOPERATIVE DIAGNOSIS:  Right knee hypertrophic synovitis.  POSTOP DIAGNOSIS:  Right knee hypertrophic synovitis.  PROCEDURE:  Right knee scope with synovectomy.  SURGEON:  Ollen Gross, M.D.  ASSISTANT:  None.  ANESTHESIA:  General.  ESTIMATED BLOOD LOSS:  Minimal.  DRAINS:  None.  COMPLICATIONS:  None.  CONDITION:  Stable to recovery.  BRIEF CLINICAL NOTE:  Mr. Ian Le is a 67 year old male, who had a right total knee arthroplasty done over a year ago.  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  PROCEDURE IN DETAIL:  After successful administration of general anesthetic, a tourniquet was placed high on his right thigh and his right lower extremity was prepped and draped in the usual sterile fashion.  Superomedial and inferolateral incisions were made and inflow cannula passed superomedial, camera passed inferolateral.  Arthroscopic visualization proceeds.  Undersurface of the patella, patellar component, and the femoral trochlear groove and the metal femoral component visualized and there is a lot of hypertrophic tissue present obliterating the suprapatellar pouch.  The knife was used to create a superolateral portal using a combination of the shaver and the ArthroCare device.  This tissue was debrided back to normal-appearing tissue.  We then sealed off with the ArthroCare.  This effectively recreated the suprapatellar space.  I then visualized the medial and lateral gutters, no evidence of any hypertrophic tissue medially, but laterally  there is and that was also removed.  Inferiorly, everything looked normal.  The joints again inspected.  No other abnormal tissue was noted.  No bleeding points were noted.  Arthroscopic equipments were removed from the lateral portals, which were closed with interrupted 4-0 nylon, 20 mL of 0.25% Marcaine with epinephrine was injected through the inflow cannula and that was removed and that portal closed with nylon. Incision was cleaned and dried and a bulky sterile dressing applied.  He was then awakened and transported to recovery in stable condition.     Ollen Gross, M.D.     FA/MEDQ  D:  07/06/2012  T:  07/06/2012  Job:  [id number]

## 2012-07-06 NOTE — Transfer of Care (Signed)
Immediate Anesthesia Transfer of Care Note  Patient: Ian Le  Procedure(s) Performed: Procedure(s) (LRB) with comments: ARTHROSCOPY KNEE (Right) - WITH SYNOVECTOMY  Patient Location: PACU  Anesthesia Type:General  Level of Consciousness: awake and alert   Airway & Oxygen Therapy: Patient Spontanous Breathing and Patient connected to nasal cannula oxygen  Post-op Assessment: Report given to PACU RN  Post vital signs: Reviewed and stable  Complications: No apparent anesthesia complications

## 2012-07-06 NOTE — Anesthesia Preprocedure Evaluation (Addendum)
Anesthesia Evaluation  Patient identified by MRN, date of birth, ID band Patient awake    Reviewed: Allergy & Precautions, H&P , NPO status , Patient's Chart, lab work & pertinent test results  Airway Mallampati: II TM Distance: >3 FB Neck ROM: Full    Dental  (+) Dental Advisory Given and Teeth Intact   Pulmonary former smoker,  breath sounds clear to auscultation  Pulmonary exam normal       Cardiovascular hypertension, Pt. on medications Rhythm:Regular Rate:Normal     Neuro/Psych negative neurological ROS  negative psych ROS   GI/Hepatic negative GI ROS, Neg liver ROS,   Endo/Other  negative endocrine ROS  Renal/GU negative Renal ROS  negative genitourinary   Musculoskeletal negative musculoskeletal ROS (+)   Abdominal   Peds  Hematology negative hematology ROS (+)   Anesthesia Other Findings   Reproductive/Obstetrics                          Anesthesia Physical Anesthesia Plan  ASA: II  Anesthesia Plan: General   Post-op Pain Management:    Induction: Intravenous  Airway Management Planned: LMA  Additional Equipment:   Intra-op Plan:   Post-operative Plan: Extubation in OR  Informed Consent: I have reviewed the patients History and Physical, chart, labs and discussed the procedure including the risks, benefits and alternatives for the proposed anesthesia with the patient or authorized representative who has indicated his/her understanding and acceptance.   Dental advisory given  Plan Discussed with: CRNA  Anesthesia Plan Comments:         Anesthesia Quick Evaluation

## 2012-07-06 NOTE — Progress Notes (Signed)
Paged Dr Okey Dupre about pt's abdominal pain. Reviewed all the medications that pt has had. Dr Okey Dupre does not want to order anymore medications. Wants pt to move and try to eat something.

## 2012-07-06 NOTE — Interval H&P Note (Signed)
History and Physical Interval Note:  07/06/2012 9:37 AM  Ian Le  has presented today for surgery, with the diagnosis of RIGHT HYPERTROPHY SYNOVITIS  The various methods of treatment have been discussed with the patient and family. After consideration of risks, benefits and other options for treatment, the patient has consented to  Procedure(s) (LRB) with comments: ARTHROSCOPY KNEE (Right) - WITH SYNOVECTOMY as a surgical intervention .  The patient's history has been reviewed, patient examined, no change in status, stable for surgery.  I have reviewed the patient's chart and labs.  Questions were answered to the patient's satisfaction.     Loanne Drilling

## 2012-07-06 NOTE — Anesthesia Procedure Notes (Signed)
Procedure Name: LMA Insertion Date/Time: 07/06/2012 9:47 AM Performed by: Maris Berger T Pre-anesthesia Checklist: Patient identified, Emergency Drugs available, Suction available and Patient being monitored Patient Re-evaluated:Patient Re-evaluated prior to inductionOxygen Delivery Method: Circle System Utilized Preoxygenation: Pre-oxygenation with 100% oxygen Intubation Type: IV induction Ventilation: Mask ventilation without difficulty LMA: LMA inserted LMA Size: 5.0 Number of attempts: 1 Placement Confirmation: positive ETCO2 Dental Injury: Teeth and Oropharynx as per pre-operative assessment  Comments: Gauze roll between teeth

## 2012-07-06 NOTE — Preoperative (Signed)
Beta Blockers   Reason not to administer Beta Blockers:Not Applicable 

## 2012-07-06 NOTE — Anesthesia Postprocedure Evaluation (Signed)
Anesthesia Post Note  Patient: Ian Le  Procedure(s) Performed: Procedure(s) (LRB): ARTHROSCOPY KNEE (Right)  Anesthesia type: General  Patient location: PACU  Post pain: Pain level controlled  Post assessment: Post-op Vital signs reviewed  Last Vitals: BP 128/83  Pulse 70  Temp 36.4 C (Oral)  Resp 14  SpO2 97%  Post vital signs: Reviewed  Level of consciousness: sedated  Complications: No apparent anesthesia complications

## 2012-07-06 NOTE — Brief Op Note (Signed)
07/06/2012  10:30 AM  PATIENT:  Ian Le  67 y.o. male  PRE-OPERATIVE DIAGNOSIS:  RIGHT HYPERTROPHY SYNOVITIS  POST-OPERATIVE DIAGNOSIS:  RIGHT HYPERTROPHY SYNOVITIS  PROCEDURE:  Procedure(s) (LRB) with comments: ARTHROSCOPY KNEE (Right) - WITH SYNOVECTOMY  SURGEON:  Surgeon(s) and Role:    * Loanne Drilling, MD - Primary  PHYSICIAN ASSISTANT:   ASSISTANTS: none   ANESTHESIA:   general  EBL:  Total I/O In: 100 [I.V.:100] Out: -   BLOOD ADMINISTERED:none   COUNTS:  YES  TOURNIQUET:  * Missing tourniquet times found for documented tourniquets in log:  81010 *  DICTATION: .Other Dictation: Dictation Number [id number]  PLAN OF CARE: Discharge to home after PACU  PATIENT DISPOSITION:  PACU - hemodynamically stable.

## 2012-07-07 ENCOUNTER — Encounter (HOSPITAL_COMMUNITY): Payer: Self-pay | Admitting: Orthopedic Surgery

## 2012-12-13 ENCOUNTER — Other Ambulatory Visit: Payer: Self-pay | Admitting: Orthopedic Surgery

## 2012-12-13 MED ORDER — DEXAMETHASONE SODIUM PHOSPHATE 10 MG/ML IJ SOLN: 10.0000 mg | Freq: Once | INTRAMUSCULAR | Status: DC

## 2012-12-13 MED ORDER — BUPIVACAINE LIPOSOME 1.3 % IJ SUSP: 20.0000 mL | Freq: Once | INTRAMUSCULAR | Status: DC

## 2012-12-13 NOTE — Progress Notes (Signed)
Preoperative surgical orders have been place into the Epic hospital system for Ian Le on 12/13/2012, 12:16 PM  by Patrica Duel for surgery on 12/21/2012.  Preop Total Hip orders including Experel Injecion, PO Tylenol, and IV Decadron as long as there are no contraindications to the above medications. [name], PA-C

## 2012-12-14 ENCOUNTER — Encounter (HOSPITAL_COMMUNITY): Payer: Self-pay | Admitting: Pharmacy Technician

## 2012-12-15 ENCOUNTER — Other Ambulatory Visit (HOSPITAL_COMMUNITY): Payer: Self-pay | Admitting: Orthopedic Surgery

## 2012-12-15 NOTE — Progress Notes (Signed)
Chest x ray 1/14, EKG 2/14 EPIC

## 2012-12-15 NOTE — Progress Notes (Signed)
EKG 7/14 chart

## 2012-12-15 NOTE — Patient Instructions (Addendum)
20 Ian Le  12/15/2012   Your procedure is scheduled on:  12/21/12 [facility]  Report to [facility] at  1215    PM  Call this number if you have problems the morning of surgery: [phone]       Remember:   Do not eat food  After Midnight. Tuesday NIGHT,  MAY HAVE CLEAR LIQUIDS  Wednesday MORNING UNTIL 0845 am, then nothing   Take these medicines the morning of surgery with A SIP OF WATER: AMLODIPINE, OMEPRAZOLE                                            MAY TAKE HYDROCODONE IF NEEDED   .  Contacts, dentures or partial plates can not be worn to surgery  Leave suitcase in the car. After surgery it may be brought to your room.  For patients admitted to the hospital, checkout time is 11:00 AM day of  discharge.             SPECIAL INSTRUCTIONS- SEE [place] PREPARING FOR SURGERY INSTRUCTION SHEET-     DO NOT WEAR JEWELRY, LOTIONS, POWDERS, OR PERFUMES.  WOMEN-- DO NOT SHAVE LEGS OR UNDERARMS FOR 12 HOURS BEFORE SHOWERS. MEN MAY SHAVE FACE.  Patients discharged the day of surgery will not be allowed to drive home. IF going home the day of surgery, you must have a driver and someone to stay with you for the first 24 hours  Name and phone number of your driver:   ADMISSION                                                                     Please read over the following fact sheets that you were given: MRSA Information, Incentive Spirometry Sheet, Blood Transfusion Sheet  Information                                                                                   Ian Le  PST 336  [phone]                 FAILURE TO FOLLOW THESE INSTRUCTIONS MAY RESULT IN  CANCELLATION   OF YOUR SURGERY                                                  Patient Signature _____________________________

## 2012-12-16 ENCOUNTER — Encounter (HOSPITAL_COMMUNITY): Payer: Self-pay

## 2012-12-16 ENCOUNTER — Encounter (HOSPITAL_COMMUNITY)
Admission: RE | Admit: 2012-12-16 | Discharge: 2012-12-16 | Disposition: A | Discharge status: Home / Self Care | Payer: Medicare Other | Source: Ambulatory Visit | Attending: Orthopedic Surgery | Admitting: Orthopedic Surgery

## 2012-12-16 ENCOUNTER — Ambulatory Visit (HOSPITAL_COMMUNITY)
Admission: RE | Admit: 2012-12-16 | Discharge: 2012-12-16 | Disposition: A | Discharge status: Home / Self Care | Payer: Medicare Other | Source: Ambulatory Visit | Attending: Orthopedic Surgery | Admitting: Orthopedic Surgery

## 2012-12-16 DIAGNOSIS — M5137 Other intervertebral disc degeneration, lumbosacral region: Secondary | ICD-10-CM | POA: Insufficient documentation

## 2012-12-16 DIAGNOSIS — M2180 Other specified acquired deformities of unspecified limb: Secondary | ICD-10-CM | POA: Insufficient documentation

## 2012-12-16 DIAGNOSIS — Z01812 Encounter for preprocedural laboratory examination: Secondary | ICD-10-CM | POA: Insufficient documentation

## 2012-12-16 DIAGNOSIS — Z01818 Encounter for other preprocedural examination: Secondary | ICD-10-CM | POA: Insufficient documentation

## 2012-12-16 DIAGNOSIS — M51379 Other intervertebral disc degeneration, lumbosacral region without mention of lumbar back pain or lower extremity pain: Secondary | ICD-10-CM | POA: Insufficient documentation

## 2012-12-16 LAB — URINALYSIS, ROUTINE W REFLEX MICROSCOPIC
Bilirubin Urine: NEGATIVE
Ketones, ur: NEGATIVE mg/dL
Nitrite: NEGATIVE
Specific Gravity, Urine: 1.031 — ABNORMAL HIGH (ref 1.005–1.030)
Urobilinogen, UA: 1 mg/dL (ref 0.0–1.0)

## 2012-12-16 LAB — COMPREHENSIVE METABOLIC PANEL
Albumin: 3.7 g/dL (ref 3.5–5.2)
BUN: 22 mg/dL (ref 6–23)
Calcium: 9.3 mg/dL (ref 8.4–10.5)
Chloride: 104 mEq/L (ref 96–112)
Creatinine, Ser: 1.1 mg/dL (ref 0.50–1.35)
Total Bilirubin: 0.6 mg/dL (ref 0.3–1.2)

## 2012-12-16 LAB — CBC
HCT: 40 % (ref 39.0–52.0)
MCH: 30.4 pg (ref 26.0–34.0)
MCHC: 32.5 g/dL (ref 30.0–36.0)
MCV: 93.7 fL (ref 78.0–100.0)
RDW: 14.5 % (ref 11.5–15.5)
WBC: 6.9 10*3/uL (ref 4.0–10.5)

## 2012-12-16 LAB — PROTIME-INR
INR: 1.08 (ref 0.00–1.49)
Prothrombin Time: 13.8 seconds (ref 11.6–15.2)

## 2012-12-16 LAB — SURGICAL PCR SCREEN: Staphylococcus aureus: NEGATIVE

## 2012-12-20 ENCOUNTER — Other Ambulatory Visit: Payer: Self-pay | Admitting: Orthopedic Surgery
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
## 2012-12-21 ENCOUNTER — Inpatient Hospital Stay (HOSPITAL_COMMUNITY)
Admission: RE | Admit: 2012-12-21 | Discharge: 2012-12-23 | DRG: 470 | Disposition: A | Discharge status: Home Health | Payer: Medicare Other | Source: Ambulatory Visit | Attending: Orthopedic Surgery | Admitting: Orthopedic Surgery

## 2012-12-21 ENCOUNTER — Encounter (HOSPITAL_COMMUNITY)
Admission: RE | Disposition: A | Discharge status: Home Health | Payer: Self-pay | Source: Ambulatory Visit | Attending: Orthopedic Surgery

## 2012-12-21 ENCOUNTER — Encounter (HOSPITAL_COMMUNITY): Payer: Self-pay | Admitting: *Deleted

## 2012-12-21 ENCOUNTER — Inpatient Hospital Stay (HOSPITAL_COMMUNITY): Payer: Medicare Other

## 2012-12-21 ENCOUNTER — Inpatient Hospital Stay (HOSPITAL_COMMUNITY): Payer: Medicare Other | Admitting: Certified Registered Nurse Anesthetist

## 2012-12-21 ENCOUNTER — Encounter (HOSPITAL_COMMUNITY): Payer: Self-pay | Admitting: Certified Registered Nurse Anesthetist

## 2012-12-21 DIAGNOSIS — Z87891 Personal history of nicotine dependence: Secondary | ICD-10-CM

## 2012-12-21 DIAGNOSIS — D62 Acute posthemorrhagic anemia: Secondary | ICD-10-CM

## 2012-12-21 DIAGNOSIS — Z96649 Presence of unspecified artificial hip joint: Secondary | ICD-10-CM

## 2012-12-21 DIAGNOSIS — Z8546 Personal history of malignant neoplasm of prostate: Secondary | ICD-10-CM

## 2012-12-21 DIAGNOSIS — I10 Essential (primary) hypertension: Secondary | ICD-10-CM

## 2012-12-21 DIAGNOSIS — M169 Osteoarthritis of hip, unspecified: Secondary | ICD-10-CM

## 2012-12-21 DIAGNOSIS — M1610 Unilateral primary osteoarthritis, unspecified hip: Principal | ICD-10-CM | POA: Diagnosis present

## 2012-12-21 HISTORY — PX: TOTAL HIP ARTHROPLASTY: SHX124

## 2012-12-21 SURGERY — ARTHROPLASTY, HIP, TOTAL,POSTERIOR APPROACH
Anesthesia: General | Site: Hip | Laterality: Left | Wound class: Clean

## 2012-12-21 MED ORDER — FLEET ENEMA 7-19 GM/118ML RE ENEM: 1.0000 | ENEMA | Freq: Once | RECTAL | Status: AC | PRN

## 2012-12-21 MED ORDER — CEFAZOLIN SODIUM 1-5 GM-% IV SOLN
1.0000 g | Freq: Four times a day (QID) | INTRAVENOUS | Status: AC
  Administered 2012-12-21 – 2012-12-22 (×2): 1 g via INTRAVENOUS
  Filled 2012-12-21 (×2): qty 50

## 2012-12-21 MED ORDER — DEXAMETHASONE 6 MG PO TABS
10.0000 mg | ORAL_TABLET | Freq: Every day | ORAL | Status: AC
  Administered 2012-12-22: 10 mg via ORAL
  Filled 2012-12-21: qty 1

## 2012-12-21 MED ORDER — FENTANYL CITRATE 0.05 MG/ML IJ SOLN
INTRAMUSCULAR | Status: DC | PRN
  Administered 2012-12-21 (×2): 50 ug via INTRAVENOUS
  Administered 2012-12-21: 100 ug via INTRAVENOUS
  Administered 2012-12-21: 50 ug via INTRAVENOUS

## 2012-12-21 MED ORDER — PROPOFOL 10 MG/ML IV BOLUS
INTRAVENOUS | Status: DC | PRN
  Administered 2012-12-21: 120 mg via INTRAVENOUS

## 2012-12-21 MED ORDER — METOCLOPRAMIDE HCL 5 MG/ML IJ SOLN
10.0000 mg | Freq: Once | INTRAMUSCULAR | Status: AC | PRN
  Administered 2012-12-21: 10 mg via INTRAVENOUS

## 2012-12-21 MED ORDER — PANTOPRAZOLE SODIUM 40 MG PO TBEC
40.0000 mg | DELAYED_RELEASE_TABLET | Freq: Every day | ORAL | Status: DC
  Filled 2012-12-21: qty 1

## 2012-12-21 MED ORDER — SODIUM CHLORIDE 0.9 % IV SOLN
INTRAVENOUS | Status: DC
  Administered 2012-12-21: 100 mL/h via INTRAVENOUS
  Administered 2012-12-22: 05:00:00 via INTRAVENOUS

## 2012-12-21 MED ORDER — METOCLOPRAMIDE HCL 5 MG/ML IJ SOLN: 5.0000 mg | Freq: Three times a day (TID) | INTRAMUSCULAR | Status: DC | PRN

## 2012-12-21 MED ORDER — TRAMADOL HCL 50 MG PO TABS: 50.0000 mg | ORAL_TABLET | Freq: Four times a day (QID) | ORAL | Status: DC | PRN

## 2012-12-21 MED ORDER — ROCURONIUM BROMIDE 100 MG/10ML IV SOLN
INTRAVENOUS | Status: DC | PRN
  Administered 2012-12-21: 50 mg via INTRAVENOUS

## 2012-12-21 MED ORDER — RIVAROXABAN 10 MG PO TABS
10.0000 mg | ORAL_TABLET | Freq: Every day | ORAL | Status: DC
  Administered 2012-12-22 – 2012-12-23 (×2): 10 mg via ORAL
  Filled 2012-12-21 (×3): qty 1

## 2012-12-21 MED ORDER — HYDROMORPHONE HCL PF 1 MG/ML IJ SOLN
0.2500 mg | INTRAMUSCULAR | Status: DC | PRN
  Administered 2012-12-21 (×2): 0.5 mg via INTRAVENOUS

## 2012-12-21 MED ORDER — DIPHENHYDRAMINE HCL 12.5 MG/5ML PO ELIX
12.5000 mg | ORAL_SOLUTION | ORAL | Status: DC | PRN
  Administered 2012-12-22: 25 mg via ORAL
  Filled 2012-12-21: qty 10

## 2012-12-21 MED ORDER — PHENYLEPHRINE HCL 10 MG/ML IJ SOLN
INTRAMUSCULAR | Status: DC | PRN
  Administered 2012-12-21 (×2): 40 ug via INTRAVENOUS

## 2012-12-21 MED ORDER — OXYCODONE HCL 5 MG PO TABS
5.0000 mg | ORAL_TABLET | ORAL | Status: DC | PRN
  Administered 2012-12-21: 5 mg via ORAL
  Administered 2012-12-21 – 2012-12-23 (×7): 10 mg via ORAL
  Filled 2012-12-21 (×4): qty 2
  Filled 2012-12-21: qty 1
  Filled 2012-12-21 (×3): qty 2

## 2012-12-21 MED ORDER — PHENOL 1.4 % MT LIQD
1.0000 | OROMUCOSAL | Status: DC | PRN
  Filled 2012-12-21: qty 177

## 2012-12-21 MED ORDER — CEFAZOLIN SODIUM-DEXTROSE 2-3 GM-% IV SOLR
2.0000 g | INTRAVENOUS | Status: AC
  Administered 2012-12-21: 2 g via INTRAVENOUS

## 2012-12-21 MED ORDER — 0.9 % SODIUM CHLORIDE (POUR BTL) OPTIME
TOPICAL | Status: DC | PRN
  Administered 2012-12-21: 1000 mL

## 2012-12-21 MED ORDER — METOCLOPRAMIDE HCL 10 MG PO TABS: 5.0000 mg | ORAL_TABLET | Freq: Three times a day (TID) | ORAL | Status: DC | PRN

## 2012-12-21 MED ORDER — METHOCARBAMOL 100 MG/ML IJ SOLN
500.0000 mg | Freq: Four times a day (QID) | INTRAVENOUS | Status: DC | PRN
  Administered 2012-12-21: 500 mg via INTRAVENOUS
  Filled 2012-12-21: qty 5

## 2012-12-21 MED ORDER — ONDANSETRON HCL 4 MG/2ML IJ SOLN
INTRAMUSCULAR | Status: DC | PRN
  Administered 2012-12-21: 4 mg via INTRAVENOUS

## 2012-12-21 MED ORDER — POLYETHYLENE GLYCOL 3350 17 G PO PACK
17.0000 g | PACK | Freq: Every day | ORAL | Status: DC | PRN
  Filled 2012-12-21: qty 1

## 2012-12-21 MED ORDER — ONDANSETRON HCL 4 MG PO TABS: 4.0000 mg | ORAL_TABLET | Freq: Four times a day (QID) | ORAL | Status: DC | PRN

## 2012-12-21 MED ORDER — LACTATED RINGERS IV SOLN
INTRAVENOUS | Status: DC
  Administered 2012-12-21: 1000 mL via INTRAVENOUS
  Administered 2012-12-21: 16:00:00 via INTRAVENOUS

## 2012-12-21 MED ORDER — DEXAMETHASONE SODIUM PHOSPHATE 10 MG/ML IJ SOLN
10.0000 mg | Freq: Every day | INTRAMUSCULAR | Status: AC
  Filled 2012-12-21: qty 1

## 2012-12-21 MED ORDER — DEXAMETHASONE SODIUM PHOSPHATE 10 MG/ML IJ SOLN
INTRAMUSCULAR | Status: DC | PRN
  Administered 2012-12-21: 10 mg via INTRAVENOUS

## 2012-12-21 MED ORDER — TRANEXAMIC ACID 100 MG/ML IV SOLN
1000.0000 mg | INTRAVENOUS | Status: AC
  Administered 2012-12-21: 1000 mg via INTRAVENOUS
  Filled 2012-12-21: qty 10

## 2012-12-21 MED ORDER — SODIUM CHLORIDE 0.9 % IJ SOLN
INTRAMUSCULAR | Status: DC | PRN
  Administered 2012-12-21: 16:00:00

## 2012-12-21 MED ORDER — DOCUSATE SODIUM 100 MG PO CAPS
100.0000 mg | ORAL_CAPSULE | Freq: Two times a day (BID) | ORAL | Status: DC
  Administered 2012-12-21 – 2012-12-23 (×4): 100 mg via ORAL
  Filled 2012-12-21 (×3): qty 1

## 2012-12-21 MED ORDER — MORPHINE SULFATE 2 MG/ML IJ SOLN
1.0000 mg | INTRAMUSCULAR | Status: DC | PRN
  Administered 2012-12-22: 2 mg via INTRAVENOUS
  Filled 2012-12-21: qty 1

## 2012-12-21 MED ORDER — ACETAMINOPHEN 500 MG PO TABS
1000.0000 mg | ORAL_TABLET | Freq: Four times a day (QID) | ORAL | Status: DC
  Administered 2012-12-21 – 2012-12-23 (×6): 1000 mg via ORAL
  Filled 2012-12-21 (×14): qty 2

## 2012-12-21 MED ORDER — BUPIVACAINE HCL 0.25 % IJ SOLN
INTRAMUSCULAR | Status: DC | PRN
  Administered 2012-12-21: 20 mL

## 2012-12-21 MED ORDER — MIDAZOLAM HCL 5 MG/5ML IJ SOLN
INTRAMUSCULAR | Status: DC | PRN
  Administered 2012-12-21: 2 mg via INTRAVENOUS

## 2012-12-21 MED ORDER — PROMETHAZINE HCL 25 MG/ML IJ SOLN: 6.2500 mg | INTRAMUSCULAR | Status: DC | PRN

## 2012-12-21 MED ORDER — AMLODIPINE BESYLATE 5 MG PO TABS
5.0000 mg | ORAL_TABLET | Freq: Every day | ORAL | Status: DC
  Administered 2012-12-22 – 2012-12-23 (×2): 5 mg via ORAL
  Filled 2012-12-21 (×3): qty 1

## 2012-12-21 MED ORDER — ONDANSETRON HCL 4 MG/2ML IJ SOLN: 4.0000 mg | Freq: Four times a day (QID) | INTRAMUSCULAR | Status: DC | PRN

## 2012-12-21 MED ORDER — ACETAMINOPHEN 650 MG RE SUPP
650.0000 mg | Freq: Four times a day (QID) | RECTAL | Status: DC
  Filled 2012-12-21 (×8): qty 1

## 2012-12-21 MED ORDER — HYDROMORPHONE HCL PF 1 MG/ML IJ SOLN
INTRAMUSCULAR | Status: DC | PRN
  Administered 2012-12-21: 0.5 mg via INTRAVENOUS
  Administered 2012-12-21: 1 mg via INTRAVENOUS
  Administered 2012-12-21: 0.5 mg via INTRAVENOUS

## 2012-12-21 MED ORDER — BISACODYL 10 MG RE SUPP: 10.0000 mg | Freq: Every day | RECTAL | Status: DC | PRN

## 2012-12-21 MED ORDER — KETOROLAC TROMETHAMINE 30 MG/ML IJ SOLN
15.0000 mg | Freq: Once | INTRAMUSCULAR | Status: AC | PRN
Start: 2012-12-21 — End: 2012-12-21
  Administered 2012-12-21: 30 mg via INTRAVENOUS

## 2012-12-21 MED ORDER — METHOCARBAMOL 500 MG PO TABS
500.0000 mg | ORAL_TABLET | Freq: Four times a day (QID) | ORAL | Status: DC | PRN
  Administered 2012-12-22: 500 mg via ORAL
  Filled 2012-12-21 (×2): qty 1

## 2012-12-21 MED ORDER — LOSARTAN POTASSIUM 50 MG PO TABS
50.0000 mg | ORAL_TABLET | Freq: Every day | ORAL | Status: DC
  Filled 2012-12-21 (×3): qty 1

## 2012-12-21 MED ORDER — SIMVASTATIN 10 MG PO TABS
10.0000 mg | ORAL_TABLET | Freq: Every day | ORAL | Status: DC
  Administered 2012-12-21 – 2012-12-22 (×2): 10 mg via ORAL
  Filled 2012-12-21 (×3): qty 1

## 2012-12-21 MED ORDER — LIDOCAINE HCL (CARDIAC) 20 MG/ML IV SOLN
INTRAVENOUS | Status: DC | PRN
  Administered 2012-12-21: 50 mg via INTRAVENOUS

## 2012-12-21 MED ORDER — BUPIVACAINE LIPOSOME 1.3 % IJ SUSP
20.0000 mL | Freq: Once | INTRAMUSCULAR | Status: DC
  Filled 2012-12-21: qty 20

## 2012-12-21 MED ORDER — SODIUM CHLORIDE 0.9 % IV SOLN: INTRAVENOUS | Status: DC

## 2012-12-21 MED ORDER — MEPERIDINE HCL 50 MG/ML IJ SOLN
6.2500 mg | INTRAMUSCULAR | Status: DC | PRN
  Administered 2012-12-21: 12.5 mg via INTRAVENOUS

## 2012-12-21 MED ORDER — ACETAMINOPHEN 500 MG PO TABS
1000.0000 mg | ORAL_TABLET | Freq: Once | ORAL | Status: AC
Start: 2012-12-21 — End: 2012-12-21
  Administered 2012-12-21: 1000 mg via ORAL
  Filled 2012-12-21: qty 2

## 2012-12-21 MED ORDER — MENTHOL 3 MG MT LOZG
1.0000 | LOZENGE | OROMUCOSAL | Status: DC | PRN
  Filled 2012-12-21: qty 9

## 2012-12-21 SURGICAL SUPPLY — 51 items
BAG ZIPLOCK 12X15 (MISCELLANEOUS) ×2 IMPLANT
BIT DRILL 2.8X128 (BIT) ×2 IMPLANT
BLADE EXTENDED COATED 6.5IN (ELECTRODE) ×2 IMPLANT
BLADE SAW SAG 73X25 THK (BLADE) ×1
BLADE SAW SGTL 73X25 THK (BLADE) ×1 IMPLANT
CAPT HIP PF COP ×2 IMPLANT
CLOTH BEACON ORANGE TIMEOUT ST (SAFETY) ×2 IMPLANT
DECANTER SPIKE VIAL GLASS SM (MISCELLANEOUS) ×2 IMPLANT
DRAPE INCISE IOBAN 66X45 STRL (DRAPES) ×2 IMPLANT
DRAPE LG THREE QUARTER DISP (DRAPES) ×2 IMPLANT
DRAPE ORTHO SPLIT 77X108 STRL (DRAPES) ×2
DRAPE POUCH INSTRU U-SHP 10X18 (DRAPES) ×2 IMPLANT
DRAPE SURG ORHT 6 SPLT 77X108 (DRAPES) ×2 IMPLANT
DRAPE U-SHAPE 47X51 STRL (DRAPES) ×2 IMPLANT
DRSG ADAPTIC 3X8 NADH LF (GAUZE/BANDAGES/DRESSINGS) ×2 IMPLANT
DRSG MEPILEX BORDER 4X4 (GAUZE/BANDAGES/DRESSINGS) ×2 IMPLANT
DRSG MEPILEX BORDER 4X8 (GAUZE/BANDAGES/DRESSINGS) ×2 IMPLANT
DURAPREP 26ML APPLICATOR (WOUND CARE) ×2 IMPLANT
ELECT REM PT RETURN 9FT ADLT (ELECTROSURGICAL) ×2
ELECTRODE REM PT RTRN 9FT ADLT (ELECTROSURGICAL) ×1 IMPLANT
EVACUATOR 1/8 PVC DRAIN (DRAIN) ×2 IMPLANT
FACESHIELD LNG OPTICON STERILE (SAFETY) ×8 IMPLANT
GLOVE BIO SURGEON STRL SZ7.5 (GLOVE) ×2 IMPLANT
GLOVE BIO SURGEON STRL SZ8 (GLOVE) ×2 IMPLANT
GLOVE BIOGEL PI IND STRL 8 (GLOVE) ×2 IMPLANT
GLOVE BIOGEL PI INDICATOR 8 (GLOVE) ×2
GOWN STRL NON-REIN LRG LVL3 (GOWN DISPOSABLE) ×2 IMPLANT
GOWN STRL REIN XL XLG (GOWN DISPOSABLE) ×4 IMPLANT
IMMOBILIZER KNEE 20 (SOFTGOODS) ×2
IMMOBILIZER KNEE 20 THIGH 36 (SOFTGOODS) ×1 IMPLANT
KIT BASIN OR (CUSTOM PROCEDURE TRAY) ×2 IMPLANT
MANIFOLD NEPTUNE II (INSTRUMENTS) ×2 IMPLANT
NDL SAFETY ECLIPSE 18X1.5 (NEEDLE) ×2 IMPLANT
NEEDLE HYPO 18GX1.5 SHARP (NEEDLE) ×2
NS IRRIG 1000ML POUR BTL (IV SOLUTION) ×2 IMPLANT
PACK TOTAL JOINT (CUSTOM PROCEDURE TRAY) ×2 IMPLANT
PASSER SUT SWANSON 36MM LOOP (INSTRUMENTS) ×2 IMPLANT
POSITIONER SURGICAL ARM (MISCELLANEOUS) ×2 IMPLANT
SPONGE GAUZE 4X4 12PLY (GAUZE/BANDAGES/DRESSINGS) ×2 IMPLANT
STRIP CLOSURE SKIN 1/2X4 (GAUZE/BANDAGES/DRESSINGS) ×4 IMPLANT
SUT ETHIBOND NAB CT1 #1 30IN (SUTURE) ×2 IMPLANT
SUT MNCRL AB 4-0 PS2 18 (SUTURE) ×2 IMPLANT
SUT VIC AB 2-0 CT1 27 (SUTURE) ×3
SUT VIC AB 2-0 CT1 TAPERPNT 27 (SUTURE) ×3 IMPLANT
SUT VLOC 180 0 24IN GS25 (SUTURE) ×2 IMPLANT
SYR 20CC LL (SYRINGE) ×2 IMPLANT
SYR 50ML LL SCALE MARK (SYRINGE) ×2 IMPLANT
TOWEL OR 17X26 10 PK STRL BLUE (TOWEL DISPOSABLE) ×2 IMPLANT
TOWEL OR NON WOVEN STRL DISP B (DISPOSABLE) ×2 IMPLANT
TRAY FOLEY CATH 14FRSI W/METER (CATHETERS) ×2 IMPLANT
WATER STERILE IRR 1500ML POUR (IV SOLUTION) ×4 IMPLANT

## 2012-12-21 NOTE — Interval H&P Note (Signed)
History and Physical Interval Note:  12/21/2012 2:29 PM  Ian Le  has presented today for surgery, with the diagnosis of OSTEOARTHRITIS LEFT HIP  The various methods of treatment have been discussed with the patient and family. After consideration of risks, benefits and other options for treatment, the patient has consented to  Procedure(s): LEFT TOTAL HIP ARTHROPLASTY (Left) as a surgical intervention .  The patient's history has been reviewed, patient examined, no change in status, stable for surgery.  I have reviewed the patient's chart and labs.  Questions were answered to the patient's satisfaction.     Loanne Drilling

## 2012-12-21 NOTE — Transfer of Care (Signed)
Immediate Anesthesia Transfer of Care Note  Patient: Ian Le  Procedure(s) Performed: Procedure(s) (LRB): LEFT TOTAL HIP ARTHROPLASTY (Left)  Patient Location: PACU  Anesthesia Type: General  Level of Consciousness: sedated, patient cooperative and responds to stimulaton  Airway & Oxygen Therapy: Patient Spontanous Breathing and Patient connected to face mask oxgen  Post-op Assessment: Report given to PACU RN and Post -op Vital signs reviewed and stable  Post vital signs: Reviewed and stable  Complications: No apparent anesthesia complications

## 2012-12-21 NOTE — Anesthesia Preprocedure Evaluation (Signed)
Anesthesia Evaluation  Patient identified by MRN, date of birth, ID band Patient awake    Reviewed: Allergy & Precautions, H&P , NPO status , Patient's Chart, lab work & pertinent test results  Airway Mallampati: II TM Distance: >3 FB Neck ROM: Full    Dental no notable dental hx.    Pulmonary neg pulmonary ROS,  breath sounds clear to auscultation  Pulmonary exam normal       Cardiovascular hypertension, Rhythm:Regular Rate:Normal     Neuro/Psych negative neurological ROS  negative psych ROS   GI/Hepatic Neg liver ROS, GERD-  Medicated,  Endo/Other  negative endocrine ROS  Renal/GU negative Renal ROS  negative genitourinary   Musculoskeletal negative musculoskeletal ROS (+)   Abdominal   Peds negative pediatric ROS (+)  Hematology negative hematology ROS (+)   Anesthesia Other Findings   Reproductive/Obstetrics negative OB ROS                           Anesthesia Physical Anesthesia Plan  ASA: II  Anesthesia Plan: General   Post-op Pain Management:    Induction: Intravenous  Airway Management Planned: Oral ETT  Additional Equipment:   Intra-op Plan:   Post-operative Plan: Extubation in OR  Informed Consent: I have reviewed the patients History and Physical, chart, labs and discussed the procedure including the risks, benefits and alternatives for the proposed anesthesia with the patient or authorized representative who has indicated his/her understanding and acceptance.   Dental advisory given  Plan Discussed with: CRNA  Anesthesia Plan Comments:         Anesthesia Quick Evaluation
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
## 2012-12-21 NOTE — Op Note (Signed)
Pre-operative diagnosis- Osteoarthritis Left hip  Post-operative diagnosis- Osteoarthritis  Left hip  Procedure-  LeftTotal Hip Arthroplasty  Surgeon- Gus Rankin. Ripken Rekowski, MD  Assistant- [name], PA-C   Anesthesia  General  EBL- 400 ml   Drain Hemovac   Complication- None  Condition-PACU - hemodynamically stable.   Brief Clinical Note- Ian Le is a 67 y.o. male with end stage arthritis of his left hip with progressively worsening pain and dysfunction. Pain occurs with activity and rest including pain at night. He has tried analgesics, protected weight bearing and rest without benefit. Pain is too severe to attempt physical therapy. Radiographs demonstrate bone on bone arthritis with subchondral cyst formation. He has had a rapidly progressive course of symptoms.He presents now for left THA.  Procedure in detail-   The patient is brought into the operating room and placed on the operating table. After successful administration of General  anesthesia, the patient is placed in the  Right lateral decubitus position with the  Left side up and held in place with the hip positioner. The lower extremity is isolated from the perineum with plastic drapes and time-out is performed by the surgical team. The lower extremity is then prepped and draped in the usual sterile fashion. A short posterolateral incision is made with a ten blade through the subcutaneous tissue to the level of the fascia lata which is incised in line with the skin incision. The sciatic nerve is palpated and protected and the short external rotators and capsule are isolated from the femur. The hip is then dislocated and the center of the femoral head is marked. A trial prosthesis is placed such that the trial head corresponds to the center of the patients' native femoral head. The resection level is marked on the femoral neck and the resection is made with an oscillating saw. The femoral head is removed and femoral  retractors placed to gain access to the femoral canal.      The canal finder is passed into the femoral canal and the canal is thoroughly irrigated with sterile saline to remove the fatty contents. Axial reaming is performed to 19.5  mm, proximal reaming to 24 F  and the sleeve machined to a small. A 24 F small trial sleeve is placed into the proximal femur.      The femur is then retracted anteriorly to gain acetabular exposure. Acetabular retractors are placed and the labrum and osteophytes are removed, Acetabular reaming is performed to 53  mm and a 54  mm Pinnacle acetabular shell is placed in anatomic position with excellent purchase. Additional dome screws were not needed. The permanent 36 mm neutral + 4 Marathon liner is placed into the acetabular shell.      The trial femur is then placed into the femoral canal. The size is 24 x 19  stem with a 36 + 12  neck and a 36 + 6 head with the neck version matching  the patients' native anteversion. The hip is reduced with excellent stability with full extension and full external rotation, 70 degrees flexion with 40 degrees adduction and 90 degrees internal rotation and 90 degrees of flexion with 70 degrees of internal rotation. The operative leg is placed on top of the non-operative leg and the leg lengths are found to be equal. The trials are then removed and the permanent implant of the same size is impacted into the femoral canal. The ceramic  femoral head of the same size as the trial is  placed and the hip is reduced with the same stability parameters. The operative leg is again placed on top of the non-operative leg and the leg lengths are found to be equal.      The wound is then copiously irrigated with saline solution and the capsule and short external rotators are re-attached to the femur through drill holes with Ethibond suture. The fascia lata is closed over a hemovac drain with #1 vicryl suture and the fascia lata, gluteal muscles and subcutaneous  tissues are injected with Exparel 20ml diluted with saline 30 ml plus 20 ml of .25% Marcaine. The subcutaneous tissues are closed with #1 and2-0 vicryl and the subcuticular layer closed with running 4-0 Monocryl. The drain is hooked to suction, incision cleaned and dried, and steri-srips and a bulky sterile dressing applied. The limb is placed into a knee immobilizer and the patient is awakened and transported to recovery in stable condition.      Please note that a surgical assistant was a medical necessity for this procedure in order to perform it in a safe and expeditious manner. The assistant was necessary to provide retraction to the vital neurovascular structures and to retract and position the limb to allow for anatomic placement of the prosthetic components.  Gus Rankin Perris Tripathi, MD    12/21/2012, 4:01 PM

## 2012-12-22 ENCOUNTER — Encounter (HOSPITAL_COMMUNITY): Payer: Self-pay | Admitting: Orthopedic Surgery

## 2012-12-22 DIAGNOSIS — I10 Essential (primary) hypertension: Secondary | ICD-10-CM

## 2012-12-22 DIAGNOSIS — D62 Acute posthemorrhagic anemia: Secondary | ICD-10-CM

## 2012-12-22 LAB — CBC
MCH: 30.8 pg (ref 26.0–34.0)
MCV: 91.3 fL (ref 78.0–100.0)
Platelets: 143 10*3/uL — ABNORMAL LOW (ref 150–400)
RDW: 14 % (ref 11.5–15.5)
WBC: 7.6 10*3/uL (ref 4.0–10.5)

## 2012-12-22 LAB — BASIC METABOLIC PANEL
Calcium: 8.5 mg/dL (ref 8.4–10.5)
Creatinine, Ser: 1.09 mg/dL (ref 0.50–1.35)
GFR calc Af Amer: 78 mL/min — ABNORMAL LOW (ref >90)

## 2012-12-22 MED ORDER — OMEPRAZOLE 20 MG PO CPDR
20.0000 mg | DELAYED_RELEASE_CAPSULE | Freq: Every day | ORAL | Status: DC
  Administered 2012-12-22 – 2012-12-23 (×2): 20 mg via ORAL
  Filled 2012-12-22 (×2): qty 1

## 2012-12-22 MED ORDER — NON FORMULARY: 20.0000 mg | Freq: Every day | Status: DC

## 2012-12-22 NOTE — Progress Notes (Signed)
   Subjective: 1 Day Post-Op Procedure(s) (LRB): LEFT TOTAL HIP ARTHROPLASTY (Left) Patient reports pain as mild.   Patient seen in rounds with Dr. Lequita Halt. Patient is well, but has had some minor complaints of pain in the hip, requiring pain medications and not sleeping much We will start therapy today.  Plan is to go Home after hospital stay.  Mr. Ian Le had some chest discomfort in PACU yesterday after coming out of surgery.  Seen and had an EKG and the discomfort was reproducible with palpation.  Felt to be some sternal discomfort due to positioning.  He denies any chest pain or discomfort today on morning rounds.  Objective: Vital signs in last 24 hours: Temp:  [97.5 F (36.4 C)-98.7 F (37.1 C)] 97.8 F (36.6 C) (07/24 0524) Pulse Rate:  [74-93] 75 (07/24 0524) Resp:  [8-22] 16 (07/24 0524) BP: (121-150)/(74-92) 121/75 mmHg (07/24 0524) SpO2:  [97 %-100 %] 97 % (07/24 0524) Weight:  [75.297 kg (166 lb)] 75.297 kg (166 lb) (07/23 1807)  Intake/Output from previous day:  Intake/Output Summary (Last 24 hours) at 12/22/12 0726 Last data filed at 12/22/12 0524  Gross per 24 hour  Intake   3885 ml  Output   1770 ml  Net   2115 ml    Intake/Output this shift: UOP 900 since MN +2115  Labs:  Recent Labs  12/22/12 0425  HGB 10.3*    Recent Labs  12/22/12 0425  WBC 7.6  RBC 3.34*  HCT 30.5*  PLT 143*    Recent Labs  12/22/12 0425  NA 136  K 4.5  CL 104  CO2 24  BUN 17  CREATININE 1.09  GLUCOSE 159*  CALCIUM 8.5   No results found for this basename: LABPT, INR,  in the last 72 hours  EXAM General - Patient is Alert, Appropriate and Oriented Extremity - Neurovascular intact Sensation intact distally Dorsiflexion/Plantar flexion intact Dressing - dressing C/D/I Motor Function - intact, moving foot and toes well on exam.  Hemovac pulled without difficulty.  Past Medical History  Diagnosis Date  . Hypertension   . Mitral valve prolapse   .  Cancer     hx of prostate cancer   . Arthritis     Assessment/Plan: 1 Day Post-Op Procedure(s) (LRB): LEFT TOTAL HIP ARTHROPLASTY (Left) Principal Problem:   OA (osteoarthritis) of hip Active Problems:   Postoperative anemia due to acute blood loss   Unspecified essential hypertension  Estimated body mass index is 24.5 kg/(m^2) as calculated from the following:   Height as of this encounter: 5\' 9"  (1.753 m).   Weight as of this encounter: 75.297 kg (166 lb). Advance diet Up with therapy Plan for discharge tomorrow Discharge home with home health DC Foley  DVT Prophylaxis - Xarelto Weight Bearing As Tolerated left Leg D/C Knee Immobilizer Hemovac Pulled Begin Therapy Hip Preacutions No vaccines.  Ian Le 12/22/2012, 7:26 AM

## 2012-12-22 NOTE — Evaluation (Signed)
Physical Therapy Evaluation Patient Details Name: Ian Le MRN: [id number] DOB: 1945/06/05 Today's Date: 12/22/2012 Time: 4098-1191 PT Time Calculation (min): 42 min  PT Assessment / Plan / Recommendation History of Present Illness     Clinical Impression  Pt s/p L THR presents with decreased L LE strength/ROM, post op pain and post THP limiting functional mobility    PT Assessment  Patient needs continued PT services    Follow Up Recommendations  Home health PT    Does the patient have the potential to tolerate intense rehabilitation      Barriers to Discharge        Equipment Recommendations  None recommended by PT    Recommendations for Other Services OT consult   Frequency 7X/week    Precautions / Restrictions Precautions Precautions: Posterior Hip;Fall Precaution Booklet Issued: Yes (comment) Precaution Comments: sign hung on wall Restrictions Weight Bearing Restrictions: No Other Position/Activity Restrictions: WBAT   Pertinent Vitals/Pain 4/10; premed, ice packs provided      Mobility  Bed Mobility Bed Mobility: Supine to Sit Supine to Sit: 3: Mod assist Details for Bed Mobility Assistance: cues for sequence, use of UEs and R LE to self assist and adherence to THP Transfers Transfers: Sit to Stand;Stand to Sit Sit to Stand: 4: Min assist Stand to Sit: 4: Min assist Details for Transfer Assistance: cues for LE management and use of UEs to self assist Ambulation/Gait Ambulation/Gait Assistance: 4: Min assist Ambulation Distance (Feet): 57 Feet Assistive device: Rolling walker Ambulation/Gait Assistance Details: cues for posture, sequence, position from RW and ER on L Gait Pattern: Step-to pattern;Decreased step length - right;Decreased step length - left;Antalgic;Trunk flexed;Shuffle    Exercises Total Joint Exercises Ankle Circles/Pumps: AROM;10 reps;Supine;Both Quad Sets: AROM;Both;10 reps;Supine Heel Slides: AAROM;15 reps;Left;Supine Hip  ABduction/ADduction: AAROM;15 reps;Left;Supine   PT Diagnosis: Difficulty walking  PT Problem List: Decreased strength;Decreased range of motion;Decreased activity tolerance;Decreased mobility;Decreased knowledge of use of DME;Pain;Decreased knowledge of precautions PT Treatment Interventions: DME instruction;Gait training;Stair training;Functional mobility training;Therapeutic activities;Therapeutic exercise;Patient/family education     PT Goals(Current goals can be found in the care plan section) Acute Rehab PT Goals Patient Stated Goal: Resume previous lifestyle with decreased pain PT Goal Formulation: With patient Time For Goal Achievement: 12/28/12 Potential to Achieve Goals: Good  Visit Information  Last PT Received On: 12/22/12 Assistance Needed: +1       Prior Functioning  Home Living Family/patient expects to be discharged to:: Private residence Living Arrangements: Spouse/significant other Available Help at Discharge: Family Type of Home: House Home Access: Stairs to enter Secretary/administrator of Steps: 2+3 Entrance Stairs-Rails: Right;None (has rail on 2 steps but not on 3) Home Layout: One level Home Equipment: Walker - 2 wheels;Cane - single point Prior Function Level of Independence: Independent Communication Communication: No difficulties Dominant Hand: Right    Cognition  Cognition Arousal/Alertness: Awake/alert Behavior During Therapy: WFL for tasks assessed/performed Overall Cognitive Status: Within Functional Limits for tasks assessed    Extremity/Trunk Assessment Upper Extremity Assessment Upper Extremity Assessment: Overall WFL for tasks assessed Lower Extremity Assessment Lower Extremity Assessment: LLE deficits/detail LLE Deficits / Details: hip strength 2+/5 with AAROM at hip to 85 flex and 20 abd   Balance    End of Session PT - End of Session Equipment Utilized During Treatment: Gait belt Activity Tolerance: Patient tolerated treatment  well Patient left: in chair;with call bell/phone within reach;with family/visitor present Nurse Communication: Mobility status  GP     Jerred Zaremba 12/22/2012, 12:20 PM

## 2012-12-22 NOTE — Evaluation (Signed)
Occupational Therapy Evaluation Patient Details Name: Ian Le MRN: [id number] DOB: 1945/07/29 Today's Date: 12/22/2012 Time: 7829-5621 OT Time Calculation (min): 27 min  OT Assessment / Plan / Recommendation History of present illness     Clinical Impression   Pt is a 67 year old man admitted for posterior approach THA.  He has THPs and is WBAT.  Pt will benefit from one more OT session to determine bathroom DME needs.  Pt verbalizes and is following THPS.  Recommend that he practice shower transfer with HHPT as I want to make sure he is safe.  He has thought through this and doesn't like the method I explained.      OT Assessment  Patient needs continued OT Services    Follow Up Recommendations  No OT follow up    Barriers to Discharge      Equipment Recommendations   (? possibly 3:1 commode; will further eval)    Recommendations for Other Services    Frequency  Min 2X/week    Precautions / Restrictions Precautions Precautions: Posterior Hip;Fall Precaution Booklet Issued: Yes (comment) Precaution Comments: sign hung on wall Restrictions Other Position/Activity Restrictions: WBAT   Pertinent Vitals/Pain L hip sore.  Repositioned in chair    ADL  Grooming: Supervision/safety Where Assessed - Grooming: Supported standing Upper Body Bathing: Set up Where Assessed - Upper Body Bathing: Unsupported sitting Lower Body Bathing: Minimal assistance (with ae) Where Assessed - Lower Body Bathing: Supported sit to stand Upper Body Dressing: Set up Where Assessed - Upper Body Dressing: Unsupported sitting Lower Body Dressing: Minimal assistance (with ae) Where Assessed - Lower Body Dressing: Supported sit to stand Toilet Transfer: Simulated;Min Pension scheme manager Method: Sit to Barista:  (chair) Toileting - Architect and Hygiene: Supervision/safety Where Assessed - Engineer, mining and Hygiene: Standing Equipment  Used: Reacher;Sock aid;Rolling walker Transfers/Ambulation Related to ADLs: pt was ambulating back from bathroom when I arrived.  Educated that he should call nursing.  Pt walked back to bathroom with me.  His toilet may be slightly higher than ours:  wife will take a measurement.  Pt has his own ideas about how to step into shower stall, and I'm not sure that safety will be optimal.  I cannot simulate his environment.  Pt is agreeable to having HHPT check on this with him.  He can borrow a shower seat but prefers not to.   ADL Comments: Pt did follow THPs ambulating.  Reviewed these with adls and pt practiced with AE.  Wife will assist with adls at home.      OT Diagnosis: Generalized weakness  OT Problem List: Decreased knowledge of use of DME or AE;Decreased knowledge of precautions;Decreased safety awareness OT Treatment Interventions: Self-care/ADL training;DME and/or AE instruction;Patient/family education   OT Goals(Current goals can be found in the care plan section) Acute Rehab OT Goals OT Goal Formulation: With patient Time For Goal Achievement: 12/29/12 Potential to Achieve Goals: Good ADL Goals Pt Will Transfer to Toilet: with supervision;bedside commode;ambulating Pt Will Perform Toileting - Clothing Manipulation and hygiene: with modified independence;sit to/from stand  Visit Information  Last OT Received On: 12/22/12 Assistance Needed: +1       Prior Functioning     Home Living Family/patient expects to be discharged to:: Private residence Living Arrangements: Spouse/significant other Home Equipment: Environmental consultant - 2 wheels;Cane - single point Additional Comments: wife will measure commode height:  they have a padded seat on top.  Pt has sink and  window sill next to it.  Pt states threshold of shower is about 4" tall and 4" across (approximately). Prior Function Level of Independence: Independent Communication Communication: No difficulties Dominant Hand: Right          Vision/Perception     Cognition  Cognition Arousal/Alertness: Awake/alert Behavior During Therapy: WFL for tasks assessed/performed Overall Cognitive Status: Within Functional Limits for tasks assessed    Extremity/Trunk Assessment Upper Extremity Assessment Upper Extremity Assessment: Overall WFL for tasks assessed     Mobility Transfers Sit to Stand: 4: Min guard;From chair/3-in-1;With upper extremity assist Details for Transfer Assistance: pt followed hip precautioins without cues with sit to stand     Exercise     Balance     End of Session OT - End of Session Activity Tolerance: Patient tolerated treatment well Patient left: in chair;with call bell/phone within reach;with family/visitor present  GO     Jamilett Ferrante 12/22/2012, 3:00 PM Marica Otter, OTR/L [phone] 12/22/2012

## 2012-12-22 NOTE — Progress Notes (Signed)
Physical Therapy Treatment Patient Details Name: Ian Le MRN: [id number] DOB: 07-Nov-1945 Today's Date: 12/22/2012 Time: 4098-1191 PT Time Calculation (min): 23 min  PT Assessment / Plan / Recommendation  History of Present Illness     Clinical Impression    PT Comments     Follow Up Recommendations  Home health PT     Does the patient have the potential to tolerate intense rehabilitation     Barriers to Discharge        Equipment Recommendations  None recommended by PT    Recommendations for Other Services OT consult  Frequency 7X/week   Progress towards PT Goals    Plan Current plan remains appropriate    Precautions / Restrictions Precautions Precautions: Posterior Hip;Fall Precaution Booklet Issued: Yes (comment) Precaution Comments: pt recalls 2/3 THP without cues Restrictions Weight Bearing Restrictions: No Other Position/Activity Restrictions: WBAT   Pertinent Vitals/Pain 5/10; premed, ice packs provided    Mobility  Bed Mobility Bed Mobility: Sit to Supine Sit to Supine: 4: Min assist Details for Bed Mobility Assistance: cues for sequence, use of UEs and R LE to self assist and adherence to THP Transfers Sit to Stand: 4: Min guard;From chair/3-in-1;With upper extremity assist Stand to Sit: 4: Min guard Details for Transfer Assistance: pt followed hip precautioins without cues with sit to stand Ambulation/Gait Ambulation/Gait Assistance: 4: Min guard Ambulation Distance (Feet): 200 Feet Assistive device: Rolling walker Ambulation/Gait Assistance Details: cues for posture, sequence, position from RW and ER on L Gait Pattern: Step-to pattern;Step-through pattern;Shuffle;Trunk flexed;Antalgic Stairs: No    Exercises     PT Diagnosis:    PT Problem List:   PT Treatment Interventions:     PT Goals (current goals can now be found in the care plan section) Acute Rehab PT Goals Patient Stated Goal: Resume previous lifestyle with decreased  pain PT Goal Formulation: With patient Time For Goal Achievement: 12/28/12 Potential to Achieve Goals: Good  Visit Information  Last PT Received On: 12/22/12 Assistance Needed: +1    Subjective Data  Patient Stated Goal: Resume previous lifestyle with decreased pain   Cognition  Cognition Arousal/Alertness: Awake/alert Behavior During Therapy: WFL for tasks assessed/performed Overall Cognitive Status: Within Functional Limits for tasks assessed    Balance     End of Session PT - End of Session Equipment Utilized During Treatment: Gait belt Activity Tolerance: Patient tolerated treatment well Patient left: in bed;with call bell/phone within reach Nurse Communication: Mobility status   GP     Karrie Fluellen 12/22/2012, 4:28 PM

## 2012-12-22 NOTE — Progress Notes (Signed)
Utilization review completed.  

## 2012-12-22 NOTE — Plan of Care (Signed)
Problem: Consults Goal: Diagnosis- Total Joint Replacement Outcome: Completed/Met Date Met:  12/22/12 Primary Total Hip LEFT

## 2012-12-23 LAB — BASIC METABOLIC PANEL
GFR calc Af Amer: 67 mL/min — ABNORMAL LOW (ref >90)
GFR calc non Af Amer: 58 mL/min — ABNORMAL LOW (ref >90)
Potassium: 4 mEq/L (ref 3.5–5.1)
Sodium: 139 mEq/L (ref 135–145)

## 2012-12-23 LAB — CBC
MCHC: 34.1 g/dL (ref 30.0–36.0)
Platelets: 154 10*3/uL (ref 150–400)
RDW: 14.6 % (ref 11.5–15.5)
WBC: 9.1 10*3/uL (ref 4.0–10.5)

## 2012-12-23 MED ORDER — METHOCARBAMOL 500 MG PO TABS: 500.0000 mg | ORAL_TABLET | Freq: Four times a day (QID) | ORAL | Status: DC | PRN

## 2012-12-23 MED ORDER — OXYCODONE HCL 5 MG PO TABS
5.0000 mg | ORAL_TABLET | ORAL | Status: DC | PRN
  Administered 2012-12-23: 5 mg via ORAL
  Administered 2012-12-23: 15 mg via ORAL
  Filled 2012-12-23: qty 1
  Filled 2012-12-23: qty 3

## 2012-12-23 MED ORDER — RIVAROXABAN 10 MG PO TABS: 10.0000 mg | ORAL_TABLET | Freq: Every day | ORAL | Status: DC

## 2012-12-23 MED ORDER — LOSARTAN POTASSIUM 50 MG PO TABS: 50.0000 mg | ORAL_TABLET | Freq: Every day | ORAL | Status: AC

## 2012-12-23 MED ORDER — TEMAZEPAM 22.5 MG PO CAPS: 22.5000 mg | ORAL_CAPSULE | Freq: Every evening | ORAL | Status: DC | PRN

## 2012-12-23 MED ORDER — OXYCODONE HCL 5 MG PO TABS: 5.0000 mg | ORAL_TABLET | ORAL | Status: DC | PRN

## 2012-12-23 NOTE — Progress Notes (Signed)
Physical Therapy Treatment Patient Details Name: Ian Le MRN: [id number] DOB: 11/13/1945 Today's Date: 12/23/2012 Time: 4098-1191 PT Time Calculation (min): 32 min  PT Assessment / Plan / Recommendation  History of Present Illness     Clinical Impression    PT Comments     Follow Up Recommendations  Home health PT     Does the patient have the potential to tolerate intense rehabilitation     Barriers to Discharge        Equipment Recommendations  None recommended by PT    Recommendations for Other Services OT consult  Frequency 7X/week   Progress towards PT Goals Progress towards PT goals: Progressing toward goals  Plan Current plan remains appropriate    Precautions / Restrictions Precautions Precautions: Posterior Hip;Fall Precaution Comments: recalls 3/3 thps Restrictions Other Position/Activity Restrictions: WBAT   Pertinent Vitals/Pain 5/10; premed, ice pack provided    Mobility  Bed Mobility Bed Mobility: Supine to Sit;Sit to Supine Supine to Sit: 5: Supervision Sit to Supine: 5: Supervision Details for Bed Mobility Assistance: used LLE to assist R, but cued not to let leg come past normal neutral position.  Gave him min A for RLE Transfers Transfers: Sit to Stand;Stand to Sit Sit to Stand: 5: Supervision;From bed;From chair/3-in-1;With upper extremity assist Stand to Sit: 5: Supervision;To bed;To chair/3-in-1;With upper extremity assist Details for Transfer Assistance: pt followed hip precautioins without cues with sit to stand Ambulation/Gait Ambulation/Gait Assistance: 4: Min guard;5: Supervision Ambulation Distance (Feet): 145 Feet (145) Assistive device: Rolling walker Ambulation/Gait Assistance Details: cues for posture and position from RW Gait Pattern: Step-to pattern;Step-through pattern;Antalgic;Trunk flexed Stairs: No    Exercises Total Joint Exercises Ankle Circles/Pumps: AROM;Supine;Both;20 reps Quad Sets: AROM;Both;10  reps;Supine Gluteal Sets: AROM;Both;10 reps;Supine Heel Slides: AAROM;Left;Supine;20 reps Hip ABduction/ADduction: AAROM;Left;Supine;20 reps   PT Diagnosis:    PT Problem List:   PT Treatment Interventions:     PT Goals (current goals can now be found in the care plan section) Acute Rehab PT Goals Patient Stated Goal: Resume previous lifestyle with decreased pain PT Goal Formulation: With patient Time For Goal Achievement: 12/28/12 Potential to Achieve Goals: Good  Visit Information  Last PT Received On: 12/23/12 Assistance Needed: +1    Subjective Data  Subjective: I'm going home this afternoon after my wife gets off work Patient Stated Goal: Resume previous lifestyle with decreased pain   Cognition  Cognition Arousal/Alertness: Awake/alert Behavior During Therapy: WFL for tasks assessed/performed Overall Cognitive Status: Within Functional Limits for tasks assessed    Balance     End of Session PT - End of Session Equipment Utilized During Treatment: Gait belt Activity Tolerance: Patient tolerated treatment well Patient left: with call bell/phone within reach;in chair Nurse Communication: Mobility status   GP     Ian Le 12/23/2012, 12:00 PM

## 2012-12-23 NOTE — Progress Notes (Signed)
Physical Therapy Treatment Patient Details Name: Ian Le MRN: [id number] DOB: Jan 05, 1946 Today's Date: 12/23/2012 Time: 0865-7846 PT Time Calculation (min): 29 min  PT Assessment / Plan / Recommendation  History of Present Illness     Clinical Impression    PT Comments   Reviewed car transfers with pt and spouse.  Practiced stairs with pt and spouse with written instructions provided.  Follow Up Recommendations  Home health PT     Does the patient have the potential to tolerate intense rehabilitation     Barriers to Discharge        Equipment Recommendations  None recommended by PT    Recommendations for Other Services OT consult  Frequency 7X/week   Progress towards PT Goals Progress towards PT goals: Progressing toward goals  Plan Current plan remains appropriate    Precautions / Restrictions Precautions Precautions: Posterior Hip;Fall Precaution Comments: recalls 3/3 thps Restrictions Weight Bearing Restrictions: No Other Position/Activity Restrictions: WBAT   Pertinent Vitals/Pain 4/10; premed    Mobility  Transfers Transfers: Sit to Stand;Stand to Sit Sit to Stand: 5: Supervision;From bed;From chair/3-in-1;With upper extremity assist Stand to Sit: 5: Supervision;To bed;With upper extremity assist Details for Transfer Assistance: cues for LE position  Ambulation/Gait Ambulation/Gait Assistance: 4: Min guard;5: Supervision Ambulation Distance (Feet): 100 Feet Assistive device: Rolling walker Ambulation/Gait Assistance Details: cues for posture, position from RW and ER on L Gait Pattern: Step-to pattern;Step-through pattern;Antalgic;Trunk flexed Stairs: Yes Stairs Assistance: 4: Min assist Stairs Assistance Details (indicate cue type and reason): cues for sequence and foot/RW placement Stair Management Technique: No rails;Step to pattern;Backwards;With walker Number of Stairs: 5 (3+2)    Exercises     PT Diagnosis:    PT Problem List:   PT  Treatment Interventions:     PT Goals (current goals can now be found in the care plan section) Acute Rehab PT Goals Patient Stated Goal: Resume previous lifestyle with decreased pain PT Goal Formulation: With patient Time For Goal Achievement: 12/28/12 Potential to Achieve Goals: Good  Visit Information  Last PT Received On: 12/23/12 Assistance Needed: +1    Subjective Data  Patient Stated Goal: Resume previous lifestyle with decreased pain   Cognition  Cognition Arousal/Alertness: Awake/alert Behavior During Therapy: WFL for tasks assessed/performed Overall Cognitive Status: Within Functional Limits for tasks assessed    Balance     End of Session PT - End of Session Equipment Utilized During Treatment: Gait belt Activity Tolerance: Patient tolerated treatment well Patient left: with call bell/phone within reach;in chair;with family/visitor present Nurse Communication: Mobility status   GP     Ian Le 12/23/2012, 5:16 PM

## 2012-12-23 NOTE — Progress Notes (Signed)
   Subjective: 2 Days Post-Op Procedure(s) (LRB): LEFT TOTAL HIP ARTHROPLASTY (Left) Patient reports pain as moderate.   Patient seen in rounds with Dr. Lequita Halt.  Still hurting a fair amount around the hip area.  He did a lot of walking yesterday though.  Tolerating medication so will increase to oral dose for better control.  Will get two sessions of therapy today and if doing well and better control of pain, then home alter this afternoon. Patient is having problems with pain in the hip, requiring pain medications Patient is planning on going home later today after two sessions of therapy.  Objective: Vital signs in last 24 hours: Temp:  [97.8 F (36.6 C)-98.5 F (36.9 C)] 97.8 F (36.6 C) (07/25 0642) Pulse Rate:  [74-77] 77 (07/25 0642) Resp:  [16] 16 (07/25 0642) BP: (119-126)/(69-78) 122/76 mmHg (07/25 0642) SpO2:  [95 %-98 %] 98 % (07/25 0642)  Intake/Output from previous day:  Intake/Output Summary (Last 24 hours) at 12/23/12 0757 Last data filed at 12/23/12 1610  Gross per 24 hour  Intake 977.33 ml  Output   1725 ml  Net -747.67 ml    Intake/Output this shift:    Labs:  Recent Labs  12/22/12 0425 12/23/12 0426  HGB 10.3* 9.8*    Recent Labs  12/22/12 0425 12/23/12 0426  WBC 7.6 9.1  RBC 3.34* 3.15*  HCT 30.5* 28.7*  PLT 143* 154    Recent Labs  12/22/12 0425 12/23/12 0426  NA 136 139  K 4.5 4.0  CL 104 107  CO2 24 25  BUN 17 18  CREATININE 1.09 1.24  GLUCOSE 159* 138*  CALCIUM 8.5 8.6   No results found for this basename: LABPT, INR,  in the last 72 hours  EXAM: General - Patient is Alert, Appropriate and Oriented Extremity - Neurovascular intact Sensation intact distally Dorsiflexion/Plantar flexion intact No cellulitis present Incision - clean, dry, no drainage, healing Motor Function - intact, moving foot and toes well on exam.   Assessment/Plan: 2 Days Post-Op Procedure(s) (LRB): LEFT TOTAL HIP ARTHROPLASTY (Left) Procedure(s)  (LRB): LEFT TOTAL HIP ARTHROPLASTY (Left) Past Medical History  Diagnosis Date  . Hypertension   . Mitral valve prolapse   . Cancer     hx of prostate cancer   . Arthritis    Principal Problem:   OA (osteoarthritis) of hip Active Problems:   Postoperative anemia due to acute blood loss   Unspecified essential hypertension  Estimated body mass index is 24.5 kg/(m^2) as calculated from the following:   Height as of this encounter: 5\' 9"  (1.753 m).   Weight as of this encounter: 75.297 kg (166 lb). Up with therapy Discharge home with home health Diet - Cardiac diet Follow up - in 2 weeks Activity - WBAT Disposition - Home Condition Upon Discharge - Good D/C Meds - See DC Summary DVT Prophylaxis - Xarelto  Herb Beltre 12/23/2012, 7:57 AM

## 2012-12-23 NOTE — Progress Notes (Addendum)
Occupational Therapy Treatment Patient Details Name: Ian Le MRN: [id number] DOB: [birth year] Today's Date: 12/23/2012 Time: 1308-6578 OT Time Calculation (min): 30 min  OT Assessment / Plan / Recommendation  History of present illness     Clinical Impression    OT comments    Follow Up Recommendations  Home Health OT    Barriers to Discharge       Equipment Recommendations  Other (comment) (pt wants to try his toilet at home, 1" too low.  Discussed adaptation needed:  Keeping back straight or preferrably leaning back slightly and keeping LLE extended to have at least 90 degree angle.  He would benefit from 3:1 commode )    Recommendations for Other Services    Frequency Min 2X/week   Progress towards OT Goals Progress towards OT goals: Progressing toward goals  Plan      Precautions / Restrictions Precautions Precautions: Posterior Hip;Fall Precaution Comments: recalls 3/3 thps Restrictions Other Position/Activity Restrictions: WBAT   Pertinent Vitals/Pain 5/10 RLE premedicated and repositioned    ADL  Toilet Transfer: Performed;Supervision/safety Toilet Transfer Method: Sit to Barista: Bedside commode Transfers/Ambulation Related to ADLs: bed mobility min A to assist with LLE from flat bed.  Pt did need one cue for internal rotation when turning in bathroom ADL Comments: Pt's commode is 18" which is slightly low for him:  19" is needed height for hip precautions.  Pt sat on commode adjusted to 20" to clear commode--lowest I could adjust it.  He was uncomfortable.  Discussed sitting back more and having something support under his leg.  He really wants to try his commode at home and will keep RLE extended through whole transfer and during sit to stand.  Pt is strong and he is able to keep his back straight during sit to stand.      OT Diagnosis:    OT Problem List:   OT Treatment Interventions:     OT Goals(current goals can now be found  in the care plan section)    Visit Information  Last OT Received On: 12/23/12 Assistance Needed: +1    Subjective Data      Prior Functioning       Cognition  Cognition Arousal/Alertness: Awake/alert Behavior During Therapy: WFL for tasks assessed/performed Overall Cognitive Status: Within Functional Limits for tasks assessed    Mobility  Bed Mobility Supine to Sit: 4: Min assist Details for Bed Mobility Assistance: used LLE to assist R, but cued not to let leg come past normal neutral position.  Gave him min A for RLE Transfers Sit to Stand: 5: Supervision;From bed;From chair/3-in-1;With upper extremity assist    Exercises      Balance     End of Session OT - End of Session Activity Tolerance: Patient tolerated treatment well Patient left: in chair;with call bell/phone within reach;with family/visitor present  GO     Ian Le 12/23/2012, 8:41 AM Marica Otter, OTR/L [phone] 12/23/2012

## 2012-12-23 NOTE — Progress Notes (Signed)
Discharge summary sent to payer through [facility]  

## 2012-12-23 NOTE — Care Management Note (Signed)
    Page 1 of 1   12/23/2012     8:59:40 AM   CARE MANAGEMENT NOTE 12/23/2012  Patient:  Ian Le, Ian Le   Account Number:  [id number]  Date Initiated:  12/23/2012  Documentation initiated by:  Lanier Clam  Subjective/Objective Assessment:   ADMITTED W/OA L  HIP     Action/Plan:   FROM HOME W/SPOUSE.   Anticipated DC Date:  12/23/2012   Anticipated DC Plan:  HOME W HOME HEALTH SERVICES      DC Planning Services  CM consult      Choice offered to / List presented to:  C-1 Patient           Status of service:  In process, will continue to follow Medicare Important Message given?   (If response is "NO", the following Medicare IM given date fields will be blank) Date Medicare IM given:   Date Additional Medicare IM given:    Discharge Disposition:    Per UR Regulation:  Reviewed for med. necessity/level of care/duration of stay  If discussed at Long Length of Stay Meetings, dates discussed:    Comments:  12/23/12 Trenia Tennyson RN,BSN NCM [phone] SPOKE TO PATIENT ABOUT D/C PLANS.[facility] CHOSEN,[facility] ALREADY FOLLOWING.PT-HH.MAY NEED RW,3N1.PATIENT WILL DECIDE ABOUT DME NEEDS.AWAIT FINAL HH/DME ORDERS.

## 2012-12-23 NOTE — Discharge Summary (Signed)
Physician Discharge Summary   Patient ID: Ian Le MRN: [id number] DOB/AGE: [birth year] 67 y.o.  Admit date: 12/21/2012 Discharge date: 12-23-2012  Primary Diagnosis:  Osteoarthritis Left hip  Admission Diagnoses:  Past Medical History  Diagnosis Date  . Hypertension   . Mitral valve prolapse   . Cancer     hx of prostate cancer   . Arthritis    Discharge Diagnoses:   Principal Problem:   OA (osteoarthritis) of hip Active Problems:   Postoperative anemia due to acute blood loss   Unspecified essential hypertension  Estimated body mass index is 24.5 kg/(m^2) as calculated from the following:   Height as of this encounter: 5\' 9"  (1.753 m).   Weight as of this encounter: 75.297 kg (166 lb).  Procedure(s) (LRB): LEFT TOTAL HIP ARTHROPLASTY (Left)   Consults: None  HPI: Ian Le is a 67 y.o. male with end stage arthritis of his left hip with progressively worsening pain and dysfunction. Pain occurs with activity and rest including pain at night. He has tried analgesics, protected weight bearing and rest without benefit. Pain is too severe to attempt physical therapy. Radiographs demonstrate bone on bone arthritis with subchondral cyst formation. He has had a rapidly progressive course of symptoms.He presents now for left THA.  Laboratory Data: Admission on 12/21/2012  Component Date Value Range Status  . ABO/RH(D) 12/21/2012 O POS   Final  . Antibody Screen 12/21/2012 NEG   Final  . Sample Expiration 12/21/2012 12/24/2012   Final  . WBC 12/22/2012 7.6  4.0 - 10.5 K/uL Final  . RBC 12/22/2012 3.34* 4.22 - 5.81 MIL/uL Final  . Hemoglobin 12/22/2012 10.3* 13.0 - 17.0 g/dL Final  . HCT 40/98/1191 30.5* 39.0 - 52.0 % Final  . MCV 12/22/2012 91.3  78.0 - 100.0 fL Final  . MCH 12/22/2012 30.8  26.0 - 34.0 pg Final  . MCHC 12/22/2012 33.8  30.0 - 36.0 g/dL Final  . RDW 47/82/9562 14.0  11.5 - 15.5 % Final  . Platelets 12/22/2012 143* 150 - 400 K/uL Final  . Sodium  12/22/2012 136  135 - 145 mEq/L Final  . Potassium 12/22/2012 4.5  3.5 - 5.1 mEq/L Final  . Chloride 12/22/2012 104  96 - 112 mEq/L Final  . CO2 12/22/2012 24  19 - 32 mEq/L Final  . Glucose, Bld 12/22/2012 159* 70 - 99 mg/dL Final  . BUN 13/12/6576 17  6 - 23 mg/dL Final  . Creatinine, Ser 12/22/2012 1.09  0.50 - 1.35 mg/dL Final  . Calcium 46/96/2952 8.5  8.4 - 10.5 mg/dL Final  . GFR calc non Af Amer 12/22/2012 67* >90 mL/min Final  . GFR calc Af Amer 12/22/2012 78* >90 mL/min Final   Comment:                                 The eGFR has been calculated                          using the CKD EPI equation.                          This calculation has not been                          validated in all clinical  situations.                          eGFR's persistently                          <90 mL/min signify                          possible Chronic Kidney Disease.  . WBC 12/23/2012 9.1  4.0 - 10.5 K/uL Final  . RBC 12/23/2012 3.15* 4.22 - 5.81 MIL/uL Final  . Hemoglobin 12/23/2012 9.8* 13.0 - 17.0 g/dL Final  . HCT 16/03/9603 28.7* 39.0 - 52.0 % Final  . MCV 12/23/2012 91.1  78.0 - 100.0 fL Final  . MCH 12/23/2012 31.1  26.0 - 34.0 pg Final  . MCHC 12/23/2012 34.1  30.0 - 36.0 g/dL Final  . RDW 54/01/8118 14.6  11.5 - 15.5 % Final  . Platelets 12/23/2012 154  150 - 400 K/uL Final  . Sodium 12/23/2012 139  135 - 145 mEq/L Final  . Potassium 12/23/2012 4.0  3.5 - 5.1 mEq/L Final  . Chloride 12/23/2012 107  96 - 112 mEq/L Final  . CO2 12/23/2012 25  19 - 32 mEq/L Final  . Glucose, Bld 12/23/2012 138* 70 - 99 mg/dL Final  . BUN 14/78/2956 18  6 - 23 mg/dL Final  . Creatinine, Ser 12/23/2012 1.24  0.50 - 1.35 mg/dL Final  . Calcium 21/30/8657 8.6  8.4 - 10.5 mg/dL Final  . GFR calc non Af Amer 12/23/2012 58* >90 mL/min Final  . GFR calc Af Amer 12/23/2012 67* >90 mL/min Final   Comment:                                 The eGFR has been calculated                           using the CKD EPI equation.                          This calculation has not been                          validated in all clinical                          situations.                          eGFR's persistently                          <90 mL/min signify                          possible Chronic Kidney Disease.  Hospital Outpatient Visit on 12/16/2012  Component Date Value Range Status  . aPTT 12/16/2012 30  24 - 37 seconds Final  . WBC 12/16/2012 6.9  4.0 - 10.5 K/uL Final  . RBC 12/16/2012 4.27  4.22 - 5.81 MIL/uL Final  . Hemoglobin 12/16/2012 13.0  13.0 - 17.0 g/dL Final  . HCT 84/69/6295 40.0  39.0 - 52.0 % Final  .  MCV 12/16/2012 93.7  78.0 - 100.0 fL Final  . MCH 12/16/2012 30.4  26.0 - 34.0 pg Final  . MCHC 12/16/2012 32.5  30.0 - 36.0 g/dL Final  . RDW 19/14/7829 14.5  11.5 - 15.5 % Final  . Platelets 12/16/2012 191  150 - 400 K/uL Final  . Sodium 12/16/2012 140  135 - 145 mEq/L Final  . Potassium 12/16/2012 4.6  3.5 - 5.1 mEq/L Final  . Chloride 12/16/2012 104  96 - 112 mEq/L Final  . CO2 12/16/2012 26  19 - 32 mEq/L Final  . Glucose, Bld 12/16/2012 97  70 - 99 mg/dL Final  . BUN 56/21/3086 22  6 - 23 mg/dL Final  . Creatinine, Ser 12/16/2012 1.10  0.50 - 1.35 mg/dL Final  . Calcium 57/84/6962 9.3  8.4 - 10.5 mg/dL Final  . Total Protein 12/16/2012 7.0  6.0 - 8.3 g/dL Final  . Albumin 95/28/4132 3.7  3.5 - 5.2 g/dL Final  . AST 44/06/270 16  0 - 37 U/L Final  . ALT 12/16/2012 15  0 - 53 U/L Final  . Alkaline Phosphatase 12/16/2012 121* 39 - 117 U/L Final  . Total Bilirubin 12/16/2012 0.6  0.3 - 1.2 mg/dL Final  . GFR calc non Af Amer 12/16/2012 67* >90 mL/min Final  . GFR calc Af Amer 12/16/2012 77* >90 mL/min Final   Comment:                                 The eGFR has been calculated                          using the CKD EPI equation.                          This calculation has not been                          validated in all clinical                            situations.                          eGFR's persistently                          <90 mL/min signify                          possible Chronic Kidney Disease.  Marland Kitchen Prothrombin Time 12/16/2012 13.8  11.6 - 15.2 seconds Final  . INR 12/16/2012 1.08  0.00 - 1.49 Final  . Color, Urine 12/16/2012 YELLOW  YELLOW Final  . APPearance 12/16/2012 CLEAR  CLEAR Final  . Specific Gravity, Urine 12/16/2012 1.031* 1.005 - 1.030 Final  . pH 12/16/2012 6.0  5.0 - 8.0 Final  . Glucose, UA 12/16/2012 NEGATIVE  NEGATIVE mg/dL Final  . Hgb urine dipstick 12/16/2012 NEGATIVE  NEGATIVE Final  . Bilirubin Urine 12/16/2012 NEGATIVE  NEGATIVE Final  . Ketones, ur 12/16/2012 NEGATIVE  NEGATIVE mg/dL Final  . Protein, ur 53/66/4403 NEGATIVE  NEGATIVE mg/dL Final  . Urobilinogen, UA 12/16/2012 1.0  0.0 - 1.0 mg/dL Final  . Nitrite 47/42/5956  NEGATIVE  NEGATIVE Final  . Leukocytes, UA 12/16/2012 NEGATIVE  NEGATIVE Final   MICROSCOPIC NOT DONE ON URINES WITH NEGATIVE PROTEIN, BLOOD, LEUKOCYTES, NITRITE, OR GLUCOSE <1000 mg/dL.  Marland Kitchen MRSA, PCR 12/16/2012 NEGATIVE  NEGATIVE Final  . Staphylococcus aureus 12/16/2012 NEGATIVE  NEGATIVE Final   Comment:                                 The Xpert SA Assay (FDA                          approved for NASAL specimens                          in patients over 38 years of age),                          is one component of                          a comprehensive surveillance                          program.  Test performance has                          been validated by [employer] for patients greater                          than or equal to 90 year old.                          It is not intended                          to diagnose infection nor to                          guide or monitor treatment.     X-Rays:Dg Hip Complete Left  12/16/2012   *RADIOLOGY REPORT*  Clinical Data: Preoperative evaluation for left hip  replacement  LEFT HIP - COMPLETE 2+ VIEW  Comparison: CT abdomen and pelvis 08/19/2008  Findings: Osseous demineralization. Symmetric SI joints. Preserved right hip joint with diffuse joint space narrowing of left hip joint. Deformity of the proximal left femur with associated areas of slight sclerosis and lucency question healed intertrochanteric fracture. Poorly defined area of lucency is seen in the proximal left femur inferior to the greater trochanter, corresponding to a cystic lucency identified on a prior CT exam. Screw fragment present at the proximal left femoral metaphysis. Osseous pelvis appears intact. Disc space narrowing L4-L5 and L2-L3.  IMPRESSION: Osseous demineralization with degenerative changes left hip joint. Deformity of the proximal left femur with vague areas of sclerosis and poorly defined lucency, question sequela of an old healed intertrochanteric fracture and postsurgical changes; recommend correlation with patient history.   Original Report Authenticated By: Ulyses Southward, M.D.   Dg Pelvis Portable  12/21/2012   *RADIOLOGY REPORT*  Clinical  Data: Postop left total hip replacement.  PORTABLE PELVIS  Comparison: 12/16/2012.  Findings: Post left total hip replacement which appears in satisfactory position without complication noted on this single projection.  Surgical drain is in place.  IMPRESSION: Post left total hip replacement which appears in satisfactory position without complication noted on this single projection. Surgical drain is in place.   Original Report Authenticated By: Lacy Duverney, M.D.    EKG: Orders placed during the hospital encounter of 12/21/12  . EKG 12-LEAD  . EKG 12-LEAD     Hospital Course: Patient was admitted to [facility] and taken to the OR and underwent the above state procedure without complications.  Patient tolerated the procedure well and was later transferred to the recovery room and then to the orthopaedic floor for postoperative care.   They were given PO and IV analgesics for pain control following their surgery.  They were given 24 hours of postoperative antibiotics of  Anti-infectives   Start     Dose/Rate Route Frequency Ordered Stop   12/22/12 0600  ceFAZolin (ANCEF) IVPB 2 g/50 mL premix     2 g 100 mL/hr over 30 Minutes Intravenous On call to O.R. 12/21/12 1248 12/21/12 1456   12/21/12 2100  ceFAZolin (ANCEF) IVPB 1 g/50 mL premix     1 g 100 mL/hr over 30 Minutes Intravenous Every 6 hours 12/21/12 1815 12/22/12 0249     and started on DVT prophylaxis in the form of Xarelto.   PT and OT were ordered for total hip protocol.  The patient was allowed to be WBAT with therapy. Discharge planning was consulted to help with postop disposition and equipment needs.  Patient had a rough night on the evening of surgery. Ian Le had some chest discomfort in PACU after coming out of surgery. Seen and had an EKG and the discomfort was reproducible with palpation. Felt to be some sternal discomfort due to positioning. He denies any chest pain or discomfort on morning rounds on POD 1.  He started to get up OOB with therapy on day one and did well with therapy waking over 50 feet the first time and then about 200 feet.  Hemovac drain was pulled without difficulty.  The knee immobilizer was removed and discontinued.  Continued to work with therapy into day two.  Dressing was changed on day two and the incision was healing well.  Patient was seen in rounds and found to have a fair amount of pain.  He pain meds were increased for better control.  He had did a lot of walking the day before. Tolerating medication so increased the oral dose for better control. Planned two sessions of therapy on day two and if doing well and better control of pain, then home later that afternoon.   Discharge Medications: Prior to Admission medications   Medication Sig Start Date End Date Taking? Authorizing Provider  acetaminophen (TYLENOL) 500 MG tablet Take  1,000 mg by mouth every 6 (six) hours as needed for pain.   Yes Historical Provider, MD  amLODipine (NORVASC) 5 MG tablet Take 5 mg by mouth daily before breakfast.   Yes Historical Provider, MD  omeprazole (PRILOSEC) 20 MG capsule Take 20 mg by mouth daily.   Yes Historical Provider, MD  simvastatin (ZOCOR) 10 MG tablet Take 10 mg by mouth at bedtime.   Yes Historical Provider, MD  losartan (COZAAR) 50 MG tablet Take 1 tablet (50 mg total) by mouth daily before breakfast. Hold if  systolic blood pressure less than 130. 12/23/12   Alexzandrew Perkins, PA-C  Patient instructed to monitor BP closely and record measurements.  His pressure has been stable.  Resume if BP goes back up and then follow up with his PCP in two weeks.  methocarbamol (ROBAXIN) 500 MG tablet Take 1 tablet (500 mg total) by mouth every 6 (six) hours as needed. 12/23/12   Alexzandrew Perkins, PA-C  oxyCODONE (OXY IR/ROXICODONE) 5 MG immediate release tablet Take 1-4 tablets (5-20 mg total) by mouth every 3 (three) hours as needed. 12/23/12   Alexzandrew Julien Girt, PA-C  rivaroxaban (XARELTO) 10 MG TABS tablet Take 1 tablet (10 mg total) by mouth daily with breakfast. Take Xarelto for two and a half more weeks, then discontinue Xarelto. Once the patient has completed the Xarelto, they may resume the 81 mg Aspirin. 12/23/12   Alexzandrew Perkins, PA-C  temazepam (RESTORIL) 22.5 MG capsule Take 1 capsule (22.5 mg total) by mouth at bedtime as needed for sleep. 12/23/12   Alexzandrew Julien Girt, PA-C  Pick up OTC iron supplement Ferrous Sulfate and take twice a day for three weeks after surgery.   Diet: Cardiac diet Activity:WBAT No bending hip over 90 degrees- A "L" Angle Do not cross legs Do not let foot roll inward When turning these patients a pillow should be placed between the patient's legs to prevent crossing. Patients should have the affected knee fully extended when trying to sit or stand from all surfaces to prevent excessive hip  flexion. When ambulating and turning toward the affected side the affected leg should have the toes turned out prior to moving the walker and the rest of patient's body as to prevent internal rotation/ turning in of the leg. Abduction pillows are the most effective way to prevent a patient from not crossing legs or turning toes in at rest. If an abduction pillow is not ordered placing a regular pillow length wise between the patient's legs is also an effective reminder. It is imperative that these precautions be maintained so that the surgical hip does not dislocate. Follow-up:in 2 weeks Disposition - Home Discharged Condition: good   Discharge Orders   Future Orders Complete By Expires     Call MD / Call 911  As directed     Comments:      If you experience chest pain or shortness of breath, CALL 911 and be transported to the hospital emergency room.  If you develope a fever above 101 F, pus (white drainage) or increased drainage or redness at the wound, or calf pain, call your surgeon's office.    Change dressing  As directed     Comments:      You may change your dressing dressing daily with sterile 4 x 4 inch gauze dressing and paper tape.  Do not submerge the incision under water.    Constipation Prevention  As directed     Comments:      Drink plenty of fluids.  Prune juice may be helpful.  You may use a stool softener, such as Colace (over the counter) 100 mg twice a day.  Use MiraLax (over the counter) for constipation as needed.    Diet - low sodium heart healthy  As directed     Discharge instructions  As directed     Comments:      Pick up stool softner and laxative for home. Do not submerge incision under water. May shower. Continue to use ice for pain and swelling from surgery.  Hip precautions.  Total Hip Protocol.  Take Xarelto for two and a half more weeks, then discontinue Xarelto. Once the patient has completed the Xarelto, they may resume the 81 mg  Aspirin.  Losartan was held temporarily after surgery. Patient instructed to monitor BP closely and record measurements.  His pressure has been stable.  Resume Losartan if BP goes back up and then follow up with his PCP in two weeks.    Do not sit on low chairs, stoools or toilet seats, as it may be difficult to get up from low surfaces  As directed     Driving restrictions  As directed     Comments:      No driving until released by the physician.    Follow the hip precautions as taught in Physical Therapy  As directed     Increase activity slowly as tolerated  As directed     Lifting restrictions  As directed     Comments:      No lifting until released by the physician.    Patient may shower  As directed     Comments:      You may shower without a dressing once there is no drainage.  Do not wash over the wound.  If drainage remains, do not shower until drainage stops.    TED hose  As directed     Comments:      Use stockings (TED hose) for 3 weeks on both leg(s).  You may remove them at night for sleeping.    Weight bearing as tolerated  As directed         Medication List    STOP taking these medications       amoxicillin 500 MG capsule  Commonly known as:  AMOXIL     aspirin EC 81 MG tablet     HYDROcodone-acetaminophen 5-325 MG per tablet  Commonly known as:  NORCO/VICODIN      TAKE these medications       acetaminophen 500 MG tablet  Commonly known as:  TYLENOL  Take 1,000 mg by mouth every 6 (six) hours as needed for pain.     amLODipine 5 MG tablet  Commonly known as:  NORVASC  Take 5 mg by mouth daily before breakfast.     losartan 50 MG tablet  Commonly known as:  COZAAR  Take 1 tablet (50 mg total) by mouth daily before breakfast. Hold if systolic blood press ure less than 130. Patient instructed to monitor BP closely and record measurements.  His pressure has been stable.  Resume if BP goes back up and then follow up with his PCP in two weeks.      methocarbamol 500 MG tablet  Commonly known as:  ROBAXIN  Take 1 tablet (500 mg total) by mouth every 6 (six) hours as needed.     omeprazole 20 MG capsule  Commonly known as:  PRILOSEC  Take 20 mg by mouth daily.     oxyCODONE 5 MG immediate release tablet  Commonly known as:  Oxy IR/ROXICODONE  Take 1-4 tablets (5-20 mg total) by mouth every 3 (three) hours as needed.     rivaroxaban 10 MG Tabs tablet  Commonly known as:  XARELTO  - Take 1 tablet (10 mg total) by mouth daily with breakfast. Take Xarelto for two and a half more weeks, then discontinue Xarelto.  - Once the patient has completed the Xarelto, they may resume the 81 mg Aspirin.  simvastatin 10 MG tablet  Commonly known as:  ZOCOR  Take 10 mg by mouth at bedtime.     temazepam 22.5 MG capsule  Commonly known as:  RESTORIL  Take 1 capsule (22.5 mg total) by mouth at bedtime as needed for sleep.    Pick up OTC iron supplement Ferrous Sulfate and take twice a day for three weeks after surgery.        Follow-up Information   Follow up with Loanne Drilling, MD. Schedule an appointment as soon as possible for a visit in 2 weeks.   Contact information:   [address] [place] [address] [phone]       Signed: Patrica Duel 12/23/2012, 8:10 AM

## 2013-01-09 NOTE — Anesthesia Postprocedure Evaluation (Signed)
  Anesthesia Post-op Note  Patient: Ian Le  Procedure(s) Performed: Procedure(s) (LRB): LEFT TOTAL HIP ARTHROPLASTY (Left)  Patient Location: PACU  Anesthesia Type: General  Level of Consciousness: awake and alert   Airway and Oxygen Therapy: Patient Spontanous Breathing  Post-op Pain: mild  Post-op Assessment: Post-op Vital signs reviewed, Patient's Cardiovascular Status Stable, Respiratory Function Stable, Patent Airway and No signs of Nausea or vomiting  Last Vitals:  Filed Vitals:   12/23/12 1356  BP: 131/78  Pulse: 73  Temp: 36.8 C  Resp: 20    Post-op Vital Signs: stable   Complications: No apparent anesthesia complications

## 2013-05-05 ENCOUNTER — Other Ambulatory Visit: Payer: Self-pay | Admitting: Gastroenterology

## 2013-05-27 ENCOUNTER — Emergency Department (HOSPITAL_COMMUNITY)
Admission: EM | Admit: 2013-05-27 | Discharge: 2013-05-27 | Disposition: A | Discharge status: Home / Self Care | Payer: Medicare Other | Source: Home / Self Care | Attending: Emergency Medicine | Admitting: Emergency Medicine

## 2013-05-27 ENCOUNTER — Encounter (HOSPITAL_COMMUNITY): Payer: Self-pay | Admitting: Emergency Medicine

## 2013-05-27 DIAGNOSIS — J069 Acute upper respiratory infection, unspecified: Secondary | ICD-10-CM

## 2013-05-27 LAB — POCT RAPID STREP A: Streptococcus, Group A Screen (Direct): NEGATIVE

## 2013-05-27 MED ORDER — BENZONATATE 100 MG PO CAPS: 100.0000 mg | ORAL_CAPSULE | Freq: Three times a day (TID) | ORAL | Status: DC | PRN

## 2013-05-27 MED ORDER — IPRATROPIUM BROMIDE 0.06 % NA SOLN: 2.0000 | Freq: Four times a day (QID) | NASAL | Status: DC

## 2013-05-27 NOTE — ED Notes (Signed)
Pt c/o cold sxs onset Tuesday w/sxs that include: ST, cough, HA, chest d/c due to cough... Reports he is drinking and eating well Denies: f/v/n/d, SOB, wheezing... Taking OTC cold meds w/no relief Alert w/no signs of acute distress.
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
## 2013-05-27 NOTE — ED Provider Notes (Signed)
Medical screening examination/treatment/procedure(s) were performed by non-physician practitioner and as supervising physician I was immediately available for consultation/collaboration.  Marylan Glore, M.D.  Floyde Dingley C Lorella Gomez, MD 05/27/13 2314 

## 2013-05-30 LAB — CULTURE, GROUP A STREP

## 2013-06-06 ENCOUNTER — Ambulatory Visit
Admission: RE | Admit: 2013-06-06 | Discharge: 2013-06-06 | Disposition: A | Discharge status: Home / Self Care | Payer: Medicare Other | Source: Ambulatory Visit | Attending: Family Medicine | Admitting: Family Medicine

## 2013-06-06 ENCOUNTER — Other Ambulatory Visit: Payer: Self-pay | Admitting: Family Medicine

## 2013-06-06 DIAGNOSIS — R05 Cough: Secondary | ICD-10-CM

## 2013-06-06 DIAGNOSIS — R059 Cough, unspecified: Secondary | ICD-10-CM

## 2013-08-31 ENCOUNTER — Other Ambulatory Visit: Payer: Self-pay | Admitting: Dermatology

## 2015-04-19 ENCOUNTER — Other Ambulatory Visit (HOSPITAL_COMMUNITY): Payer: Self-pay | Admitting: Orthopedic Surgery

## 2015-04-19 DIAGNOSIS — T84032A Mechanical loosening of internal right knee prosthetic joint, initial encounter: Secondary | ICD-10-CM

## 2015-05-02 ENCOUNTER — Encounter (HOSPITAL_COMMUNITY)
Admission: RE | Admit: 2015-05-02 | Discharge: 2015-05-02 | Disposition: A | Discharge status: Home / Self Care | Payer: Medicare Other | Source: Ambulatory Visit | Attending: Orthopedic Surgery | Admitting: Orthopedic Surgery

## 2015-05-02 DIAGNOSIS — Y838 Other surgical procedures as the cause of abnormal reaction of the patient, or of later complication, without mention of misadventure at the time of the procedure: Secondary | ICD-10-CM | POA: Insufficient documentation

## 2015-05-02 DIAGNOSIS — T84032A Mechanical loosening of internal right knee prosthetic joint, initial encounter: Secondary | ICD-10-CM

## 2015-05-02 MED ORDER — TECHNETIUM TC 99M MEDRONATE IV KIT: 25.0000 | PACK | Freq: Once | INTRAVENOUS | Status: DC | PRN

## 2016-09-30 IMAGING — NM NM BONE 3 PHASE
10 series · 20 of 20 positions shown · non-contrast
Comparison: None.

CLINICAL DATA: RIGHT knee pain.  RIGHT knee replacement 6386.

EXAM:
NUCLEAR MEDICINE 3-PHASE BONE SCAN
TECHNIQUE: Radionuclide angiographic images, immediate static blood pool
images, and 3-hour delayed static images were obtained of the knees
after intravenous injection of radiopharmaceutical.
RADIOPHARMACEUTICALS:  mCi Zc-UUm MDP

[Series 1: flow · 2.07mm/px · 6 of 48 frames shown (1 of 2)]
[frame 5/48  full-range]
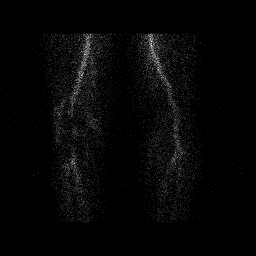
[frame 13/48  full-range]
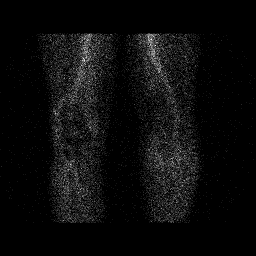
[frame 21/48  full-range]
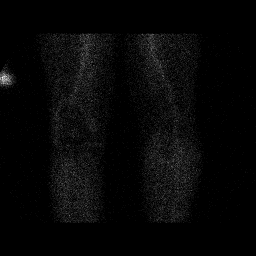
[frame 29/48  full-range]
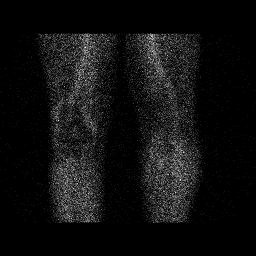
[frame 37/48  full-range]
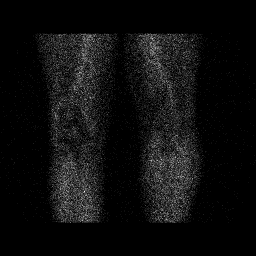
[frame 45/48  full-range]
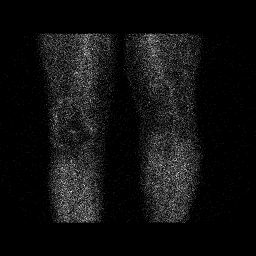

[Series 1: flow · 2.07mm/px · 6 of 48 frames shown (2 of 2)]
[frame 5/48  full-range]
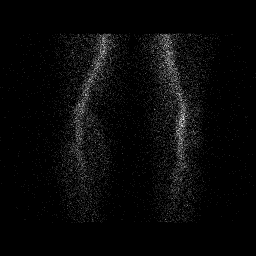
[frame 13/48  full-range]
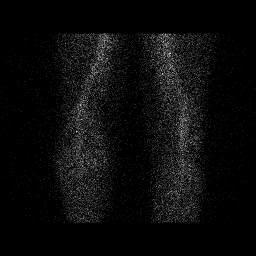
[frame 21/48  full-range]
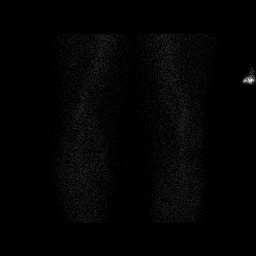
[frame 29/48  full-range]
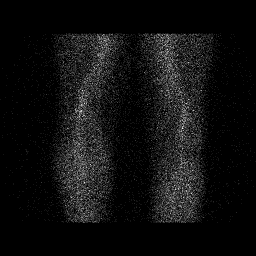
[frame 37/48  full-range]
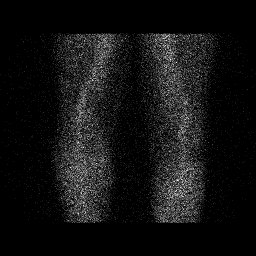
[frame 45/48  full-range]
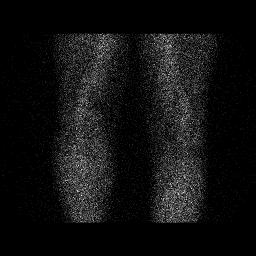

[Series 2: blood pool · 2.07mm/px · 1 of 1 slices shown (1 of 6)]
[im 1/1]
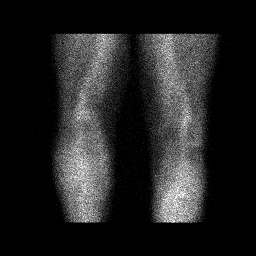

[Series 2: blood pool · 2.07mm/px · 1 of 1 slices shown (2 of 6)]
[im 1/1]
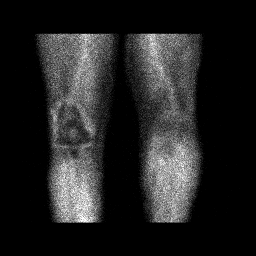

[Series 3: lat bp · 2.07mm/px · 1 of 1 slices shown (1 of 2)]
[im 1/1]
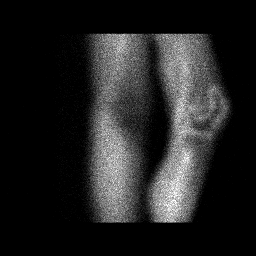

[Series 3: lat bp · 2.07mm/px · 1 of 1 slices shown (2 of 2)]
[im 1/1]
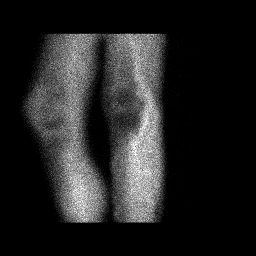

[Series 4: blood pool · 2.07mm/px · 1 of 1 slices shown (3 of 6)]
[im 1/1]
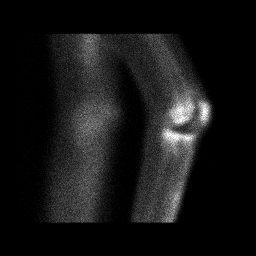

[Series 4: blood pool · 2.07mm/px · 1 of 1 slices shown (4 of 6)]
[im 1/1]
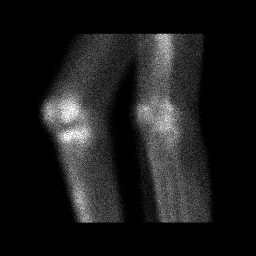

[Series 5: blood pool · 2.07mm/px · 1 of 1 slices shown (5 of 6)]
[im 1/1]
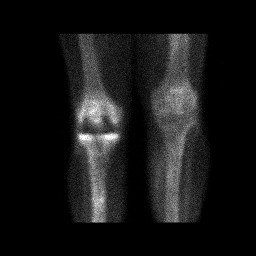

[Series 5: blood pool · 2.07mm/px · 1 of 1 slices shown (6 of 6)]
[im 1/1]
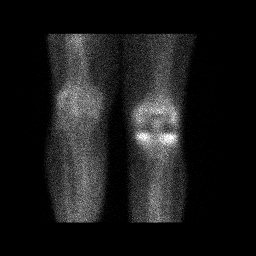

[20 of 20 positions shown; findings below may reference images not displayed]

FINDINGS: Vascular phase: Very faint increased blood flow to the RIGHT knee
compared to the LEFT. This is best seen on the summed images.

Blood pool phase: Blood pool imaging demonstrates mild increased
uptake within the RIGHT knee outlining the joint capsule above the
joint.

Delayed phase: Mild uptake within the RIGHT tibial plateau medially
and laterally.
IMPRESSION: 1. Faint uptake associated with the joint capsule on the RIGHT
suggest bursitis or capsulitis.
2.  No evidence of prosthetic loosening.

## 2017-01-30 DIAGNOSIS — I7090 Unspecified atherosclerosis: Secondary | ICD-10-CM

## 2017-01-30 DIAGNOSIS — N281 Cyst of kidney, acquired: Secondary | ICD-10-CM

## 2017-01-30 DIAGNOSIS — K7689 Other specified diseases of liver: Secondary | ICD-10-CM

## 2017-01-30 HISTORY — DX: Unspecified atherosclerosis: I70.90

## 2017-01-30 HISTORY — DX: Other specified diseases of liver: K76.89

## 2017-01-30 HISTORY — DX: Cyst of kidney, acquired: N28.1

## 2017-02-16 ENCOUNTER — Other Ambulatory Visit: Payer: Self-pay | Admitting: Family Medicine

## 2017-02-16 ENCOUNTER — Ambulatory Visit
Admission: RE | Admit: 2017-02-16 | Discharge: 2017-02-16 | Disposition: A | Discharge status: Home / Self Care | Payer: Medicare Other | Source: Ambulatory Visit | Attending: Family Medicine | Admitting: Family Medicine

## 2017-02-16 DIAGNOSIS — R1903 Right lower quadrant abdominal swelling, mass and lump: Secondary | ICD-10-CM

## 2017-02-16 MED ORDER — IOPAMIDOL (ISOVUE-300) INJECTION 61%
100.0000 mL | Freq: Once | INTRAVENOUS | Status: AC | PRN
  Administered 2017-02-16: 100 mL via INTRAVENOUS

## 2017-09-10 ENCOUNTER — Encounter (HOSPITAL_BASED_OUTPATIENT_CLINIC_OR_DEPARTMENT_OTHER): Payer: Self-pay

## 2017-09-10 ENCOUNTER — Other Ambulatory Visit: Payer: Self-pay

## 2017-09-10 ENCOUNTER — Ambulatory Visit: Payer: Self-pay | Admitting: General Surgery

## 2017-09-10 NOTE — Progress Notes (Signed)
Spoke with:  Damaria NPO:  After Midnight, no gum, candy, or mints   Arrival time:  0700 Labs: Istat4, EKG AM medications:  Losartan, Omeprazole, Pre surgery Ensure Pre op orders: No Ride home:  Jerrye Beavers (wife) [phone]

## 2017-09-13 NOTE — Pre-Procedure Instructions (Signed)
Mr. Hove made aware and verbalized understanding that he will get lab work and pick up pre surgery drink 09/15/2017.

## 2017-09-13 NOTE — Progress Notes (Signed)
PT GETTING LAB WORK DONE Wednesday 09-15-2017 @ 1100 (CBCdiff, BMET).  WILL ALSO PICK UP ENSURE PRE SURGERY DRINK.

## 2017-09-15 ENCOUNTER — Encounter (HOSPITAL_COMMUNITY)
Admission: RE | Admit: 2017-09-15 | Discharge: 2017-09-15 | Disposition: A | Discharge status: Home / Self Care | Payer: Medicare Other | Source: Ambulatory Visit | Attending: General Surgery | Admitting: General Surgery

## 2017-09-15 DIAGNOSIS — Z01810 Encounter for preprocedural cardiovascular examination: Secondary | ICD-10-CM | POA: Insufficient documentation

## 2017-09-15 DIAGNOSIS — Z01812 Encounter for preprocedural laboratory examination: Secondary | ICD-10-CM | POA: Insufficient documentation

## 2017-09-15 DIAGNOSIS — Z01818 Encounter for other preprocedural examination: Secondary | ICD-10-CM | POA: Diagnosis present

## 2017-09-15 LAB — CBC WITH DIFFERENTIAL/PLATELET
BASOS PCT: 1 %
Basophils Absolute: 0.1 10*3/uL (ref 0.0–0.1)
EOS PCT: 8 %
Eosinophils Absolute: 0.4 10*3/uL (ref 0.0–0.7)
HCT: 38.8 % — ABNORMAL LOW (ref 39.0–52.0)
HEMOGLOBIN: 12 g/dL — AB (ref 13.0–17.0)
Lymphocytes Relative: 29 %
Lymphs Abs: 1.6 10*3/uL (ref 0.7–4.0)
MCH: 27.8 pg (ref 26.0–34.0)
MCHC: 30.9 g/dL (ref 30.0–36.0)
MCV: 90 fL (ref 78.0–100.0)
Monocytes Absolute: 0.5 10*3/uL (ref 0.1–1.0)
Monocytes Relative: 9 %
Neutro Abs: 2.9 10*3/uL (ref 1.7–7.7)
Neutrophils Relative %: 53 %
Platelets: 207 10*3/uL (ref 150–400)
RBC: 4.31 MIL/uL (ref 4.22–5.81)
RDW: 14.3 % (ref 11.5–15.5)
WBC: 5.4 10*3/uL (ref 4.0–10.5)

## 2017-09-15 LAB — BASIC METABOLIC PANEL
Anion gap: 9 (ref 5–15)
BUN: 12 mg/dL (ref 6–20)
CALCIUM: 8.9 mg/dL (ref 8.9–10.3)
CO2: 22 mmol/L (ref 22–32)
CREATININE: 1.2 mg/dL (ref 0.61–1.24)
Chloride: 112 mmol/L — ABNORMAL HIGH (ref 101–111)
GFR, EST NON AFRICAN AMERICAN: 58 mL/min — AB (ref >60)
Glucose, Bld: 103 mg/dL — ABNORMAL HIGH (ref 65–99)
Potassium: 5.1 mmol/L (ref 3.5–5.1)
SODIUM: 143 mmol/L (ref 135–145)

## 2017-09-15 NOTE — Progress Notes (Signed)
PT HERE TODAY FOR LAB WORK AND EKG. PRINTED INSTRUCTIONS AND GIVEN TP PT FOR HIBICLENS (CHG) SHOWER HS BEFORE AND AM DOS , NPO AFTER MN W/ EXCEPTION CLEAR LIQUID DIET UNTIL 0600 AT WHICH TIME WILL FINISH ENSURE PRE-SURGERY DRINK. PT VERBALIZED UNDERSTANDING OF INSTRUCTIONS.

## 2017-09-15 NOTE — Progress Notes (Addendum)
Your procedure is scheduled on  ____04-25-2019________  Report to [facility] AT   7:00 A. M.__   Call this number if you have problems the morning of surgery  :[phone].   OUR ADDRESS IS [place].  WE ARE LOCATED IN [facility].                                     REMEMBER:  DO NOT EAT FOOD AFTER MIDNIGHT .  CLEAR LIQUID DIET FROM MIDNIGHT TO 6:00 AM (SEE BELOW)   TAKE THESE MEDICATIONS MORNING OF SURGERY WITH A SIP OF WATER:  _____LOSARTAN, OMEPRAZOLE----                                   DO NOT WEAR JEWERLY, MAKE UP DO NOT WEAR LOTIONS, POWDERS, PERFUMES OR DEODORANT TO DAY OF SURGERY.  MEN MAY SHAVE FACE AND NECK.  CONTACTS, GLASSES, OR DENTURES MAY NOT BE WORN TO SURGERY.                                    [facility] IS NOT RESPONSIBLE  FOR ANY BELONGINGS.                                                                    .                                                                 NO SOLID FOOD AFTER MIDNIGHT THE NIGHT PRIOR TO SURGERY. NOTHING BY MOUTH EXCEPT CLEAR LIQUIDS UNTIL 3 HOURS PRIOR TO [place] SURGERY. PLEASE FINISH ENSURE DRINK PER SURGEON ORDER 3 HOURS PRIOR TO SCHEDULED SURGERY TIME WHICH NEEDS TO BE COMPLETED AT ___6:00 AM___.   CLEAR LIQUID DIET   Foods Allowed                                                                     Foods Excluded  Coffee and tea, regular and decaf                             liquids that you cannot  Plain Jell-O in any flavor                                             see through such as: Fruit ices (not with fruit pulp)  milk, soups, orange juice  Iced Popsicles                                    All solid food Carbonated beverages, regular and diet                                    Cranberry, grape and apple juices Sports drinks like Gatorade Lightly seasoned clear broth or consume(fat free) Sugar, honey syrup  Sample  Menu Breakfast                                Lunch                                     Supper Cranberry juice                    Beef broth                            Chicken broth Jell-O                                     Grape juice                           Apple juice Coffee or tea                        Jell-O                                      Popsicle                                                Coffee or tea                        Coffee or tea  _____________________________________________________________________  [facility] - Preparing for Surgery Before surgery, you can play an important role.  Because skin is not sterile, your skin needs to be as free of germs as possible.  You can reduce the number of germs on your skin by washing with CHG (chlorahexidine gluconate) soap before surgery.  CHG is an antiseptic cleaner which kills germs and bonds with the skin to continue killing germs even after washing. Please DO NOT use if you have an allergy to CHG or antibacterial soaps.  If your skin becomes reddened/irritated stop using the CHG and inform your nurse when you arrive at Short Stay. Do not shave (including legs and underarms) for at least 48 hours prior to the first CHG shower.  You may shave your face/neck. Please follow these instructions carefully:  1.  Shower with CHG Soap the night before surgery and the  morning of Surgery.  2.  If you choose to  wash your hair, wash your hair first as usual with your  normal  shampoo.  3.  After you shampoo, rinse your hair and body thoroughly to remove the  shampoo.                           4.  Use CHG as you would any other liquid soap.  You can apply chg directly  to the skin and wash                       Gently with a scrungie or clean washcloth.  5.  Apply the CHG Soap to your body ONLY FROM THE NECK DOWN.   Do not use on face/ open                           Wound or open sores. Avoid contact with eyes, ears mouth and genitals  (private parts).                       Wash face,  Genitals (private parts) with your normal soap.             6.  Wash thoroughly, paying special attention to the area where your surgery  will be performed.  7.  Thoroughly rinse your body with warm water from the neck down.  8.  DO NOT shower/wash with your normal soap after using and rinsing off  the CHG Soap.                9.  Pat yourself dry with a clean towel.            10.  Wear clean pajamas.            11.  Place clean sheets on your bed the night of your first shower and do not  sleep with pets. Day of Surgery : Do not apply any lotions/deodorants the morning of surgery.  Please wear clean clothes to the hospital/surgery center.  FAILURE TO FOLLOW THESE INSTRUCTIONS MAY RESULT IN THE CANCELLATION OF YOUR SURGERY PATIENT SIGNATURE_________________________________  NURSE SIGNATURE__________________________________  ________________________________________________________________________

## 2017-09-22 NOTE — Anesthesia Preprocedure Evaluation (Signed)
Anesthesia Evaluation  Patient identified by MRN, date of birth, ID band Patient awake    Reviewed: Allergy & Precautions, H&P , NPO status , Patient's Chart, lab work & pertinent test results  Airway Mallampati: II  TM Distance: >3 FB Neck ROM: Full    Dental no notable dental hx.    Pulmonary former smoker,    Pulmonary exam normal breath sounds clear to auscultation       Cardiovascular hypertension, Pt. on medications Normal cardiovascular exam Rhythm:Regular Rate:Normal  EKG 4/19 Sinus bradycardia Left axis deviation   Neuro/Psych negative neurological ROS  negative psych ROS   GI/Hepatic Neg liver ROS, GERD  Medicated,  Endo/Other  negative endocrine ROS  Renal/GU negative Renal ROS  negative genitourinary   Musculoskeletal  (+) Arthritis , Osteoarthritis,    Abdominal   Peds negative pediatric ROS (+)  Hematology  (+) anemia ,   Anesthesia Other Findings   Reproductive/Obstetrics negative OB ROS                             Anesthesia Physical  Anesthesia Plan  ASA: II  Anesthesia Plan: General   Post-op Pain Management:    Induction: Intravenous  PONV Risk Score and Plan: 1 and Ondansetron and Treatment may vary due to age or medical condition  Airway Management Planned: Oral ETT and LMA  Additional Equipment:   Intra-op Plan:   Post-operative Plan: Extubation in OR  Informed Consent: I have reviewed the patients History and Physical, chart, labs and discussed the procedure including the risks, benefits and alternatives for the proposed anesthesia with the patient or authorized representative who has indicated his/her understanding and acceptance.   Dental advisory given  Plan Discussed with: CRNA, Anesthesiologist and Surgeon  Anesthesia Plan Comments:         Anesthesia Quick Evaluation

## 2017-09-23 ENCOUNTER — Encounter (HOSPITAL_BASED_OUTPATIENT_CLINIC_OR_DEPARTMENT_OTHER)
Admission: RE | Disposition: A | Discharge status: Home / Self Care | Payer: Self-pay | Source: Ambulatory Visit | Attending: General Surgery

## 2017-09-23 ENCOUNTER — Ambulatory Visit (HOSPITAL_BASED_OUTPATIENT_CLINIC_OR_DEPARTMENT_OTHER)
Admission: RE | Admit: 2017-09-23 | Discharge: 2017-09-23 | Disposition: A | Discharge status: Home / Self Care | Payer: Medicare Other | Source: Ambulatory Visit | Attending: General Surgery | Admitting: General Surgery

## 2017-09-23 ENCOUNTER — Encounter (HOSPITAL_BASED_OUTPATIENT_CLINIC_OR_DEPARTMENT_OTHER): Payer: Self-pay

## 2017-09-23 ENCOUNTER — Ambulatory Visit (HOSPITAL_BASED_OUTPATIENT_CLINIC_OR_DEPARTMENT_OTHER): Payer: Medicare Other | Admitting: Anesthesiology

## 2017-09-23 DIAGNOSIS — K219 Gastro-esophageal reflux disease without esophagitis: Secondary | ICD-10-CM | POA: Insufficient documentation

## 2017-09-23 DIAGNOSIS — Z7982 Long term (current) use of aspirin: Secondary | ICD-10-CM | POA: Insufficient documentation

## 2017-09-23 DIAGNOSIS — Z79899 Other long term (current) drug therapy: Secondary | ICD-10-CM | POA: Diagnosis not present

## 2017-09-23 DIAGNOSIS — I10 Essential (primary) hypertension: Secondary | ICD-10-CM | POA: Insufficient documentation

## 2017-09-23 DIAGNOSIS — K429 Umbilical hernia without obstruction or gangrene: Secondary | ICD-10-CM | POA: Diagnosis not present

## 2017-09-23 DIAGNOSIS — Z8546 Personal history of malignant neoplasm of prostate: Secondary | ICD-10-CM | POA: Insufficient documentation

## 2017-09-23 DIAGNOSIS — Z87891 Personal history of nicotine dependence: Secondary | ICD-10-CM | POA: Insufficient documentation

## 2017-09-23 DIAGNOSIS — I700 Atherosclerosis of aorta: Secondary | ICD-10-CM | POA: Insufficient documentation

## 2017-09-23 HISTORY — DX: Gastro-esophageal reflux disease without esophagitis: K21.9

## 2017-09-23 HISTORY — PX: UMBILICAL HERNIA REPAIR: SHX196

## 2017-09-23 HISTORY — DX: Diverticulosis of intestine, part unspecified, without perforation or abscess without bleeding: K57.90

## 2017-09-23 HISTORY — DX: Cyst of kidney, acquired: N28.1

## 2017-09-23 HISTORY — DX: Other specified diseases of liver: K76.89

## 2017-09-23 HISTORY — DX: Unspecified atherosclerosis: I70.90

## 2017-09-23 SURGERY — REPAIR, HERNIA, UMBILICAL, ADULT
Anesthesia: General | Site: Abdomen

## 2017-09-23 MED ORDER — HYDROCODONE-ACETAMINOPHEN 5-325 MG PO TABS: 1.0000 | ORAL_TABLET | Freq: Four times a day (QID) | ORAL | 0 refills | Status: AC | PRN

## 2017-09-23 MED ORDER — MEPERIDINE HCL 25 MG/ML IJ SOLN
6.2500 mg | INTRAMUSCULAR | Status: DC | PRN
  Filled 2017-09-23: qty 1

## 2017-09-23 MED ORDER — SUGAMMADEX SODIUM 200 MG/2ML IV SOLN
INTRAVENOUS | Status: DC | PRN
  Administered 2017-09-23: 200 mg via INTRAVENOUS

## 2017-09-23 MED ORDER — ONDANSETRON HCL 4 MG/2ML IJ SOLN
INTRAMUSCULAR | Status: AC
  Filled 2017-09-23: qty 2

## 2017-09-23 MED ORDER — PROPOFOL 10 MG/ML IV BOLUS
INTRAVENOUS | Status: DC | PRN
  Administered 2017-09-23: 120 mg via INTRAVENOUS

## 2017-09-23 MED ORDER — CHLORHEXIDINE GLUCONATE 4 % EX LIQD
60.0000 mL | Freq: Once | CUTANEOUS | Status: DC
  Filled 2017-09-23: qty 118

## 2017-09-23 MED ORDER — FENTANYL CITRATE (PF) 100 MCG/2ML IJ SOLN
INTRAMUSCULAR | Status: DC | PRN
  Administered 2017-09-23: 50 ug via INTRAVENOUS

## 2017-09-23 MED ORDER — LIDOCAINE 2% (20 MG/ML) 5 ML SYRINGE
INTRAMUSCULAR | Status: DC | PRN
  Administered 2017-09-23: 80 mg via INTRAVENOUS

## 2017-09-23 MED ORDER — CEFAZOLIN SODIUM-DEXTROSE 2-4 GM/100ML-% IV SOLN
INTRAVENOUS | Status: AC
  Filled 2017-09-23: qty 100

## 2017-09-23 MED ORDER — KETOROLAC TROMETHAMINE 30 MG/ML IJ SOLN
INTRAMUSCULAR | Status: AC
  Filled 2017-09-23: qty 1

## 2017-09-23 MED ORDER — SUGAMMADEX SODIUM 200 MG/2ML IV SOLN
INTRAVENOUS | Status: AC
  Filled 2017-09-23: qty 2

## 2017-09-23 MED ORDER — PROPOFOL 10 MG/ML IV BOLUS
INTRAVENOUS | Status: AC
  Filled 2017-09-23: qty 20

## 2017-09-23 MED ORDER — BUPIVACAINE LIPOSOME 1.3 % IJ SUSP
INTRAMUSCULAR | Status: DC | PRN
  Administered 2017-09-23: 50 mL

## 2017-09-23 MED ORDER — IBUPROFEN 600 MG PO TABS: 600.0000 mg | ORAL_TABLET | Freq: Four times a day (QID) | ORAL | 0 refills | Status: AC | PRN

## 2017-09-23 MED ORDER — ROCURONIUM BROMIDE 100 MG/10ML IV SOLN
INTRAVENOUS | Status: DC | PRN
  Administered 2017-09-23: 40 mg via INTRAVENOUS

## 2017-09-23 MED ORDER — GABAPENTIN 300 MG PO CAPS
300.0000 mg | ORAL_CAPSULE | ORAL | Status: AC
  Administered 2017-09-23: 300 mg via ORAL
  Filled 2017-09-23: qty 1

## 2017-09-23 MED ORDER — PHENYLEPHRINE 40 MCG/ML (10ML) SYRINGE FOR IV PUSH (FOR BLOOD PRESSURE SUPPORT)
PREFILLED_SYRINGE | INTRAVENOUS | Status: DC | PRN
  Administered 2017-09-23: 80 ug via INTRAVENOUS
  Administered 2017-09-23 (×8): 40 ug via INTRAVENOUS

## 2017-09-23 MED ORDER — ONDANSETRON HCL 4 MG/2ML IJ SOLN
INTRAMUSCULAR | Status: DC | PRN
  Administered 2017-09-23: 4 mg via INTRAVENOUS

## 2017-09-23 MED ORDER — DEXAMETHASONE SODIUM PHOSPHATE 10 MG/ML IJ SOLN
INTRAMUSCULAR | Status: AC
  Filled 2017-09-23: qty 1

## 2017-09-23 MED ORDER — FENTANYL CITRATE (PF) 100 MCG/2ML IJ SOLN
25.0000 ug | INTRAMUSCULAR | Status: DC | PRN
  Filled 2017-09-23: qty 1

## 2017-09-23 MED ORDER — LIDOCAINE 2% (20 MG/ML) 5 ML SYRINGE
INTRAMUSCULAR | Status: AC
  Filled 2017-09-23: qty 5

## 2017-09-23 MED ORDER — ACETAMINOPHEN 500 MG PO TABS
ORAL_TABLET | ORAL | Status: AC
Start: 2017-09-23 — End: 2017-09-23
  Filled 2017-09-23: qty 2

## 2017-09-23 MED ORDER — ROCURONIUM BROMIDE 10 MG/ML (PF) SYRINGE
PREFILLED_SYRINGE | INTRAVENOUS | Status: AC
  Filled 2017-09-23: qty 5

## 2017-09-23 MED ORDER — GABAPENTIN 300 MG PO CAPS
ORAL_CAPSULE | ORAL | Status: AC
  Filled 2017-09-23: qty 1

## 2017-09-23 MED ORDER — MIDAZOLAM HCL 5 MG/5ML IJ SOLN
INTRAMUSCULAR | Status: DC | PRN
  Administered 2017-09-23: 1 mg via INTRAVENOUS

## 2017-09-23 MED ORDER — ACETAMINOPHEN 500 MG PO TABS
1000.0000 mg | ORAL_TABLET | ORAL | Status: AC
  Administered 2017-09-23: 1000 mg via ORAL
  Filled 2017-09-23: qty 2

## 2017-09-23 MED ORDER — FENTANYL CITRATE (PF) 100 MCG/2ML IJ SOLN
INTRAMUSCULAR | Status: AC
  Filled 2017-09-23: qty 2

## 2017-09-23 MED ORDER — DEXAMETHASONE SODIUM PHOSPHATE 10 MG/ML IJ SOLN
INTRAMUSCULAR | Status: DC | PRN
  Administered 2017-09-23: 10 mg via INTRAVENOUS

## 2017-09-23 MED ORDER — MIDAZOLAM HCL 2 MG/2ML IJ SOLN
INTRAMUSCULAR | Status: AC
  Filled 2017-09-23: qty 2

## 2017-09-23 MED ORDER — LACTATED RINGERS IV SOLN
INTRAVENOUS | Status: DC
  Administered 2017-09-23 (×2): via INTRAVENOUS
  Filled 2017-09-23: qty 1000

## 2017-09-23 MED ORDER — EPHEDRINE SULFATE-NACL 50-0.9 MG/10ML-% IV SOSY
PREFILLED_SYRINGE | INTRAVENOUS | Status: DC | PRN
  Administered 2017-09-23 (×4): 5 mg via INTRAVENOUS

## 2017-09-23 MED ORDER — PHENYLEPHRINE 40 MCG/ML (10ML) SYRINGE FOR IV PUSH (FOR BLOOD PRESSURE SUPPORT)
PREFILLED_SYRINGE | INTRAVENOUS | Status: AC
  Filled 2017-09-23: qty 10

## 2017-09-23 MED ORDER — KETOROLAC TROMETHAMINE 30 MG/ML IJ SOLN
INTRAMUSCULAR | Status: DC | PRN
  Administered 2017-09-23: 15 mg via INTRAVENOUS

## 2017-09-23 MED ORDER — CEFAZOLIN SODIUM-DEXTROSE 2-4 GM/100ML-% IV SOLN
2.0000 g | INTRAVENOUS | Status: AC
  Administered 2017-09-23: 2 g via INTRAVENOUS
  Filled 2017-09-23: qty 100

## 2017-09-23 MED ORDER — EPHEDRINE 5 MG/ML INJ
INTRAVENOUS | Status: AC
  Filled 2017-09-23: qty 10

## 2017-09-23 MED ORDER — ENSURE PRE-SURGERY PO LIQD
296.0000 mL | Freq: Once | ORAL | Status: DC
  Filled 2017-09-23: qty 296

## 2017-09-23 MED ORDER — BUPIVACAINE HCL 0.5 % IJ SOLN: INTRAMUSCULAR | Status: DC | PRN

## 2017-09-23 SURGICAL SUPPLY — 45 items
BLADE HEX COATED 2.75 (ELECTRODE) ×4 IMPLANT
BLADE SURG 15 STRL LF DISP TIS (BLADE) ×2 IMPLANT
BLADE SURG 15 STRL SS (BLADE) ×2
BLADE SURG ROTATE 9660 (MISCELLANEOUS) IMPLANT
CANISTER SUCT 3000ML PPV (MISCELLANEOUS) ×4 IMPLANT
CELLS DAT CNTRL 66122 CELL SVR (MISCELLANEOUS) IMPLANT
CHLORAPREP W/TINT 26ML (MISCELLANEOUS) ×4 IMPLANT
COVER BACK TABLE 60X90IN (DRAPES) ×4 IMPLANT
COVER MAYO STAND STRL (DRAPES) ×4 IMPLANT
DECANTER SPIKE VIAL GLASS SM (MISCELLANEOUS) IMPLANT
DERMABOND ADVANCED (GAUZE/BANDAGES/DRESSINGS) ×2
DERMABOND ADVANCED .7 DNX12 (GAUZE/BANDAGES/DRESSINGS) ×2 IMPLANT
DRAPE LAPAROSCOPIC ABDOMINAL (DRAPES) ×4 IMPLANT
DRAPE UTILITY XL STRL (DRAPES) ×4 IMPLANT
ELECT REM PT RETURN 9FT ADLT (ELECTROSURGICAL) ×4
ELECTRODE REM PT RTRN 9FT ADLT (ELECTROSURGICAL) ×2 IMPLANT
GLOVE BIOGEL PI IND STRL 7.0 (GLOVE) ×2 IMPLANT
GLOVE BIOGEL PI INDICATOR 7.0 (GLOVE) ×2
GLOVE SURG SS PI 7.0 STRL IVOR (GLOVE) ×4 IMPLANT
GOWN STRL REUS W/ TWL LRG LVL3 (GOWN DISPOSABLE) ×4 IMPLANT
GOWN STRL REUS W/TWL LRG LVL3 (GOWN DISPOSABLE) ×4
KIT TURNOVER CYSTO (KITS) ×4 IMPLANT
NEEDLE HYPO 25X1 1.5 SAFETY (NEEDLE) ×4 IMPLANT
NS IRRIG 500ML POUR BTL (IV SOLUTION) ×4 IMPLANT
PACK BASIN DAY SURGERY FS (CUSTOM PROCEDURE TRAY) ×4 IMPLANT
PENCIL BUTTON HOLSTER BLD 10FT (ELECTRODE) ×4 IMPLANT
RTRCTR WOUND ALEXIS 18CM MED (MISCELLANEOUS)
RTRCTR WOUND ALEXIS 18CM SML (INSTRUMENTS)
SAVER CELL AAL HAEMONETICS (INSTRUMENTS) IMPLANT
SPONGE LAP 4X18 X RAY DECT (DISPOSABLE) ×4 IMPLANT
SUT MNCRL AB 4-0 PS2 18 (SUTURE) ×4 IMPLANT
SUT NOVA NAB GS-21 0 18 T12 DT (SUTURE) ×4 IMPLANT
SUT PDS AB 0 CT1 36 (SUTURE) IMPLANT
SUT PROLENE 0 CT 1 CR/8 (SUTURE) IMPLANT
SUT VIC AB 0 SH 27 (SUTURE) IMPLANT
SUT VIC AB 2-0 SH 27 (SUTURE)
SUT VIC AB 2-0 SH 27X BRD (SUTURE) IMPLANT
SUT VIC AB 3-0 SH 27 (SUTURE) ×2
SUT VIC AB 3-0 SH 27X BRD (SUTURE) ×2 IMPLANT
SYR BULB IRRIGATION 50ML (SYRINGE) ×4 IMPLANT
SYR CONTROL 10ML LL (SYRINGE) ×4 IMPLANT
TOWEL OR 17X24 6PK STRL BLUE (TOWEL DISPOSABLE) ×8 IMPLANT
TUBE CONNECTING 12'X1/4 (SUCTIONS) ×1
TUBE CONNECTING 12X1/4 (SUCTIONS) ×3 IMPLANT
YANKAUER SUCT BULB TIP NO VENT (SUCTIONS) ×4 IMPLANT
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
## 2017-09-23 NOTE — Discharge Instructions (Signed)

## 2017-09-23 NOTE — Transfer of Care (Signed)
Immediate Anesthesia Transfer of Care Note  Patient: Ian Le  Procedure(s) Performed: OPEN UMBILICAL HERNIA REPAIR ERAS PATHWAY (N/A Abdomen)  Patient Location: PACU  Anesthesia Type:General  Level of Consciousness: awake and patient cooperative  Airway & Oxygen Therapy: Patient Spontanous Breathing and Patient connected to nasal cannula oxygen  Post-op Assessment: Report given to RN and Post -op Vital signs reviewed and stable  Post vital signs: Reviewed and stable  Last Vitals:  Vitals Value Taken Time  BP 143/87 09/23/2017 10:03 AM  Temp    Pulse 76 09/23/2017 10:04 AM  Resp 17 09/23/2017 10:04 AM  SpO2 94 % 09/23/2017 10:04 AM  Vitals shown include unvalidated device data.  Last Pain:  Vitals:   09/23/17 0741  TempSrc:   PainSc: 0-No pain      Patients Stated Pain Goal: 4 (74/93/55 2174)  Complications: No apparent anesthesia complications

## 2017-09-23 NOTE — Op Note (Signed)
PATIENT:  Ian Le  72 y.o. male  PRE-OPERATIVE DIAGNOSIS:  Umbilical hernia  POST-OPERATIVE DIAGNOSIS:  Umbilical hernia  PROCEDURE:  Procedure(s): OPEN UMBILICAL HERNIA REPAIR ERAS PATHWAY   SURGEON:  Surgeon(s): Abdulai Blaylock, Arta Bruce, MD  ASSISTANT: none   ANESTHESIA:   local and general  Indications for procedure: [name] is a 72 y.o. year old male with symptoms of abdominal pain and enlarged umbilical bulge.  Description of procedure: The patient was brought into the operative suite. Anesthesia was administered with General endotracheal anesthesia. WHO checklist was applied. The patient was then placed in supine. The area was prepped and draped in the usual sterile fashion.  Next the infraumbilical skin was anesthetized with Exparel:Marcaine. A semilunar infraumbilical incision was made. Cautery and blunt dissection was used to dissect down to the fascia. The hernia sac was dissected free from surrounding tissues in 360 degrees. The umbilical skin was dissected free of the hernia sac with cautery. The contents of the hernia sac were reduced. The hernia defect was 1 cm in diameter. The hernia sac was removed. Due to the size of the hernia, no mesh was utilized. The fascial defect was then primarily closed with interrupted 0 prolene sutures. The umbilical skin was sutured to the fascia with a 3-0 vicryl. The deep dermal space was closed with a 3-0 vicryl. 40 ml Exparel:Marcaine was injected into the muscle layer and around the fascia. The skin was closed with a 4-0 monocryl subcuticular suture. Dermabond was put in place for dressing. The patient awoke from anesthesia and was brought to pacu in stable condition. All counts were correct.  Findings: 1cm umbilical hernia with hernia sac containing fat  Specimen: none  Blood loss: 20 ml  Local anesthesia: 50 ml Exparel: Marcaine mix  Complications: none  PLAN OF CARE: Discharge to home after PACU  PATIENT DISPOSITION:   PACU - hemodynamically stable.  Gurney Maxin, M.D. General, Bariatric, & Minimally Invasive Surgery [facility], [place]  09/23/2017 9:53 AM

## 2017-09-23 NOTE — Anesthesia Postprocedure Evaluation (Signed)
Anesthesia Post Note  Patient: JOSEAN LYCAN  Procedure(s) Performed: OPEN UMBILICAL HERNIA REPAIR ERAS PATHWAY (N/A Abdomen)     Patient location during evaluation: PACU Anesthesia Type: General Level of consciousness: awake and alert Pain management: pain level controlled Vital Signs Assessment: post-procedure vital signs reviewed and stable Respiratory status: spontaneous breathing, nonlabored ventilation, respiratory function stable and patient connected to nasal cannula oxygen Cardiovascular status: blood pressure returned to baseline and stable Postop Assessment: no apparent nausea or vomiting Anesthetic complications: no    Last Vitals:  Vitals:   09/23/17 1045 09/23/17 1100  BP: (!) 142/91 (!) 142/83  Pulse: 77 67  Resp: 18 18  Temp:  36.6 C  SpO2: 100% 97%    Last Pain:  Vitals:   09/23/17 1100  TempSrc:   PainSc: 0-No pain                 Zynia Wojtowicz

## 2017-09-23 NOTE — Anesthesia Procedure Notes (Signed)
Procedure Name: Intubation Date/Time: 09/23/2017 9:12 AM Performed by: Gwyndolyn Saxon, CRNA Pre-anesthesia Checklist: Patient identified, Emergency Drugs available, Suction available, Patient being monitored and Timeout performed Patient Re-evaluated:Patient Re-evaluated prior to induction Oxygen Delivery Method: Circle system utilized Preoxygenation: Pre-oxygenation with 100% oxygen Induction Type: IV induction Ventilation: Mask ventilation without difficulty Laryngoscope Size: Miller and 2 Grade View: Grade I Tube type: Oral Tube size: 8.0 mm Number of attempts: 1 Placement Confirmation: ETT inserted through vocal cords under direct vision,  positive ETCO2,  CO2 detector and breath sounds checked- equal and bilateral Secured at: 23 cm Tube secured with: Tape Dental Injury: Teeth and Oropharynx as per pre-operative assessment

## 2017-09-24 ENCOUNTER — Encounter (HOSPITAL_BASED_OUTPATIENT_CLINIC_OR_DEPARTMENT_OTHER): Payer: Self-pay | Admitting: General Surgery

## 2020-08-16 DIAGNOSIS — M21752 Unequal limb length (acquired), left femur: Secondary | ICD-10-CM | POA: Diagnosis not present

## 2020-08-16 DIAGNOSIS — M722 Plantar fascial fibromatosis: Secondary | ICD-10-CM | POA: Diagnosis not present

## 2020-08-16 DIAGNOSIS — M79671 Pain in right foot: Secondary | ICD-10-CM | POA: Diagnosis not present

## 2020-08-16 DIAGNOSIS — M7731 Calcaneal spur, right foot: Secondary | ICD-10-CM | POA: Diagnosis not present

## 2020-09-17 DIAGNOSIS — M722 Plantar fascial fibromatosis: Secondary | ICD-10-CM | POA: Diagnosis not present

## 2020-09-25 DIAGNOSIS — Z0000 Encounter for general adult medical examination without abnormal findings: Secondary | ICD-10-CM | POA: Diagnosis not present

## 2020-09-25 DIAGNOSIS — E538 Deficiency of other specified B group vitamins: Secondary | ICD-10-CM | POA: Diagnosis not present

## 2020-09-25 DIAGNOSIS — E7800 Pure hypercholesterolemia, unspecified: Secondary | ICD-10-CM | POA: Diagnosis not present

## 2020-09-25 DIAGNOSIS — K219 Gastro-esophageal reflux disease without esophagitis: Secondary | ICD-10-CM | POA: Diagnosis not present

## 2020-09-25 DIAGNOSIS — Z1389 Encounter for screening for other disorder: Secondary | ICD-10-CM | POA: Diagnosis not present

## 2020-09-25 DIAGNOSIS — M1990 Unspecified osteoarthritis, unspecified site: Secondary | ICD-10-CM | POA: Diagnosis not present

## 2020-09-25 DIAGNOSIS — N183 Chronic kidney disease, stage 3 unspecified: Secondary | ICD-10-CM | POA: Diagnosis not present

## 2020-09-25 DIAGNOSIS — I129 Hypertensive chronic kidney disease with stage 1 through stage 4 chronic kidney disease, or unspecified chronic kidney disease: Secondary | ICD-10-CM | POA: Diagnosis not present

## 2020-09-25 DIAGNOSIS — G629 Polyneuropathy, unspecified: Secondary | ICD-10-CM | POA: Diagnosis not present

## 2020-10-08 DIAGNOSIS — M21752 Unequal limb length (acquired), left femur: Secondary | ICD-10-CM | POA: Diagnosis not present

## 2020-10-08 DIAGNOSIS — M722 Plantar fascial fibromatosis: Secondary | ICD-10-CM | POA: Diagnosis not present

## 2021-03-03 DIAGNOSIS — M79605 Pain in left leg: Secondary | ICD-10-CM | POA: Diagnosis not present

## 2021-03-03 DIAGNOSIS — Z23 Encounter for immunization: Secondary | ICD-10-CM | POA: Diagnosis not present

## 2021-06-25 DIAGNOSIS — R059 Cough, unspecified: Secondary | ICD-10-CM | POA: Diagnosis not present

## 2021-06-25 DIAGNOSIS — J069 Acute upper respiratory infection, unspecified: Secondary | ICD-10-CM | POA: Diagnosis not present

## 2021-06-25 DIAGNOSIS — J029 Acute pharyngitis, unspecified: Secondary | ICD-10-CM | POA: Diagnosis not present

## 2021-09-09 DIAGNOSIS — J029 Acute pharyngitis, unspecified: Secondary | ICD-10-CM | POA: Diagnosis not present

## 2021-09-09 DIAGNOSIS — K625 Hemorrhage of anus and rectum: Secondary | ICD-10-CM | POA: Diagnosis not present

## 2021-09-26 DIAGNOSIS — M1990 Unspecified osteoarthritis, unspecified site: Secondary | ICD-10-CM | POA: Diagnosis not present

## 2021-09-26 DIAGNOSIS — K625 Hemorrhage of anus and rectum: Secondary | ICD-10-CM | POA: Diagnosis not present

## 2021-09-26 DIAGNOSIS — I129 Hypertensive chronic kidney disease with stage 1 through stage 4 chronic kidney disease, or unspecified chronic kidney disease: Secondary | ICD-10-CM | POA: Diagnosis not present

## 2021-09-26 DIAGNOSIS — N183 Chronic kidney disease, stage 3 unspecified: Secondary | ICD-10-CM | POA: Diagnosis not present

## 2021-09-26 DIAGNOSIS — G629 Polyneuropathy, unspecified: Secondary | ICD-10-CM | POA: Diagnosis not present

## 2021-09-26 DIAGNOSIS — I700 Atherosclerosis of aorta: Secondary | ICD-10-CM | POA: Diagnosis not present

## 2021-09-26 DIAGNOSIS — E538 Deficiency of other specified B group vitamins: Secondary | ICD-10-CM | POA: Diagnosis not present

## 2021-09-26 DIAGNOSIS — Z0000 Encounter for general adult medical examination without abnormal findings: Secondary | ICD-10-CM | POA: Diagnosis not present

## 2021-09-26 DIAGNOSIS — K219 Gastro-esophageal reflux disease without esophagitis: Secondary | ICD-10-CM | POA: Diagnosis not present

## 2021-09-26 DIAGNOSIS — E7800 Pure hypercholesterolemia, unspecified: Secondary | ICD-10-CM | POA: Diagnosis not present

## 2021-11-14 DIAGNOSIS — K625 Hemorrhage of anus and rectum: Secondary | ICD-10-CM | POA: Diagnosis not present

## 2021-12-25 DIAGNOSIS — D125 Benign neoplasm of sigmoid colon: Secondary | ICD-10-CM | POA: Diagnosis not present

## 2021-12-25 DIAGNOSIS — K921 Melena: Secondary | ICD-10-CM | POA: Diagnosis not present

## 2021-12-25 DIAGNOSIS — K5730 Diverticulosis of large intestine without perforation or abscess without bleeding: Secondary | ICD-10-CM | POA: Diagnosis not present

## 2021-12-25 DIAGNOSIS — K649 Unspecified hemorrhoids: Secondary | ICD-10-CM | POA: Diagnosis not present

## 2021-12-25 DIAGNOSIS — Z86010 Personal history of colonic polyps: Secondary | ICD-10-CM | POA: Diagnosis not present

## 2021-12-25 DIAGNOSIS — Z800 Family history of malignant neoplasm of digestive organs: Secondary | ICD-10-CM | POA: Diagnosis not present

## 2021-12-29 DIAGNOSIS — D125 Benign neoplasm of sigmoid colon: Secondary | ICD-10-CM | POA: Diagnosis not present

## 2022-04-21 DIAGNOSIS — M7731 Calcaneal spur, right foot: Secondary | ICD-10-CM | POA: Diagnosis not present

## 2022-04-21 DIAGNOSIS — M722 Plantar fascial fibromatosis: Secondary | ICD-10-CM | POA: Diagnosis not present

## 2022-09-29 DIAGNOSIS — G629 Polyneuropathy, unspecified: Secondary | ICD-10-CM | POA: Diagnosis not present

## 2022-09-29 DIAGNOSIS — K219 Gastro-esophageal reflux disease without esophagitis: Secondary | ICD-10-CM | POA: Diagnosis not present

## 2022-09-29 DIAGNOSIS — E7800 Pure hypercholesterolemia, unspecified: Secondary | ICD-10-CM | POA: Diagnosis not present

## 2022-09-29 DIAGNOSIS — M1990 Unspecified osteoarthritis, unspecified site: Secondary | ICD-10-CM | POA: Diagnosis not present

## 2022-09-29 DIAGNOSIS — N183 Chronic kidney disease, stage 3 unspecified: Secondary | ICD-10-CM | POA: Diagnosis not present

## 2022-09-29 DIAGNOSIS — E538 Deficiency of other specified B group vitamins: Secondary | ICD-10-CM | POA: Diagnosis not present

## 2022-09-29 DIAGNOSIS — Z0000 Encounter for general adult medical examination without abnormal findings: Secondary | ICD-10-CM | POA: Diagnosis not present

## 2022-09-29 DIAGNOSIS — I129 Hypertensive chronic kidney disease with stage 1 through stage 4 chronic kidney disease, or unspecified chronic kidney disease: Secondary | ICD-10-CM | POA: Diagnosis not present

## 2022-09-29 DIAGNOSIS — I700 Atherosclerosis of aorta: Secondary | ICD-10-CM | POA: Diagnosis not present

## 2023-03-02 DIAGNOSIS — L570 Actinic keratosis: Secondary | ICD-10-CM | POA: Diagnosis not present

## 2023-04-03 DIAGNOSIS — Z23 Encounter for immunization: Secondary | ICD-10-CM | POA: Diagnosis not present

## 2023-05-06 DIAGNOSIS — L0390 Cellulitis, unspecified: Secondary | ICD-10-CM | POA: Diagnosis not present

## 2023-05-06 DIAGNOSIS — J329 Chronic sinusitis, unspecified: Secondary | ICD-10-CM | POA: Diagnosis not present

## 2023-09-27 DIAGNOSIS — Z85828 Personal history of other malignant neoplasm of skin: Secondary | ICD-10-CM | POA: Diagnosis not present

## 2023-09-27 DIAGNOSIS — L738 Other specified follicular disorders: Secondary | ICD-10-CM | POA: Diagnosis not present

## 2023-09-30 DIAGNOSIS — D509 Iron deficiency anemia, unspecified: Secondary | ICD-10-CM | POA: Diagnosis not present

## 2023-09-30 DIAGNOSIS — I129 Hypertensive chronic kidney disease with stage 1 through stage 4 chronic kidney disease, or unspecified chronic kidney disease: Secondary | ICD-10-CM | POA: Diagnosis not present

## 2023-09-30 DIAGNOSIS — I10 Essential (primary) hypertension: Secondary | ICD-10-CM | POA: Diagnosis not present

## 2023-09-30 DIAGNOSIS — E538 Deficiency of other specified B group vitamins: Secondary | ICD-10-CM | POA: Diagnosis not present

## 2023-09-30 DIAGNOSIS — Z0000 Encounter for general adult medical examination without abnormal findings: Secondary | ICD-10-CM | POA: Diagnosis not present

## 2023-09-30 DIAGNOSIS — N1831 Chronic kidney disease, stage 3a: Secondary | ICD-10-CM | POA: Diagnosis not present

## 2023-09-30 DIAGNOSIS — E782 Mixed hyperlipidemia: Secondary | ICD-10-CM | POA: Diagnosis not present

## 2024-02-10 DIAGNOSIS — M2170 Unequal limb length (acquired), unspecified site: Secondary | ICD-10-CM | POA: Diagnosis not present

## 2024-02-10 DIAGNOSIS — R600 Localized edema: Secondary | ICD-10-CM | POA: Diagnosis not present

## 2024-02-10 DIAGNOSIS — M722 Plantar fascial fibromatosis: Secondary | ICD-10-CM | POA: Diagnosis not present

## 2024-03-28 DIAGNOSIS — M2170 Unequal limb length (acquired), unspecified site: Secondary | ICD-10-CM | POA: Diagnosis not present

## 2024-03-28 DIAGNOSIS — M722 Plantar fascial fibromatosis: Secondary | ICD-10-CM | POA: Diagnosis not present

## 2024-03-28 DIAGNOSIS — M21861 Other specified acquired deformities of right lower leg: Secondary | ICD-10-CM | POA: Diagnosis not present
